# Patient Record
Sex: Male | Born: 1960 | Race: White | Hispanic: No | Marital: Married | Smoking: Never smoker
Health system: Southern US, Community
[De-identification: ages and names within clinical notes are randomized; demographics above are authoritative.]

## PROBLEM LIST (undated history)

## (undated) DIAGNOSIS — M069 Rheumatoid arthritis, unspecified: Secondary | ICD-10-CM

## (undated) DIAGNOSIS — E119 Type 2 diabetes mellitus without complications: Secondary | ICD-10-CM

## (undated) DIAGNOSIS — T884XXA Failed or difficult intubation, initial encounter: Secondary | ICD-10-CM

## (undated) DIAGNOSIS — G4730 Sleep apnea, unspecified: Secondary | ICD-10-CM

## (undated) DIAGNOSIS — I2699 Other pulmonary embolism without acute cor pulmonale: Secondary | ICD-10-CM

## (undated) DIAGNOSIS — K5790 Diverticulosis of intestine, part unspecified, without perforation or abscess without bleeding: Secondary | ICD-10-CM

## (undated) DIAGNOSIS — M359 Systemic involvement of connective tissue, unspecified: Secondary | ICD-10-CM

## (undated) DIAGNOSIS — I10 Essential (primary) hypertension: Secondary | ICD-10-CM

## (undated) DIAGNOSIS — Z9989 Dependence on other enabling machines and devices: Secondary | ICD-10-CM

## (undated) DIAGNOSIS — Z9981 Dependence on supplemental oxygen: Secondary | ICD-10-CM

---

## 1999-02-07 DIAGNOSIS — I2699 Other pulmonary embolism without acute cor pulmonale: Secondary | ICD-10-CM

## 1999-02-07 HISTORY — DX: Other pulmonary embolism without acute cor pulmonale: I26.99

## 2003-05-10 ENCOUNTER — Other Ambulatory Visit: Payer: Self-pay

## 2004-05-18 ENCOUNTER — Encounter: Payer: Self-pay | Admitting: Internal Medicine

## 2004-05-22 ENCOUNTER — Emergency Department: Payer: Self-pay | Admitting: Emergency Medicine

## 2004-06-06 ENCOUNTER — Encounter: Payer: Self-pay | Admitting: Internal Medicine

## 2004-07-07 ENCOUNTER — Encounter: Payer: Self-pay | Admitting: Internal Medicine

## 2004-08-06 ENCOUNTER — Encounter: Payer: Self-pay | Admitting: Internal Medicine

## 2004-09-06 ENCOUNTER — Encounter: Payer: Self-pay | Admitting: Internal Medicine

## 2004-10-07 ENCOUNTER — Encounter: Payer: Self-pay | Admitting: Internal Medicine

## 2004-11-06 ENCOUNTER — Encounter: Payer: Self-pay | Admitting: Internal Medicine

## 2004-12-07 ENCOUNTER — Encounter: Payer: Self-pay | Admitting: Internal Medicine

## 2005-01-06 ENCOUNTER — Encounter: Payer: Self-pay | Admitting: Internal Medicine

## 2005-02-06 ENCOUNTER — Encounter: Payer: Self-pay | Admitting: Internal Medicine

## 2010-10-29 DIAGNOSIS — M1710 Unilateral primary osteoarthritis, unspecified knee: Secondary | ICD-10-CM | POA: Insufficient documentation

## 2010-10-29 DIAGNOSIS — M069 Rheumatoid arthritis, unspecified: Secondary | ICD-10-CM | POA: Insufficient documentation

## 2010-10-29 DIAGNOSIS — M109 Gout, unspecified: Secondary | ICD-10-CM | POA: Insufficient documentation

## 2010-11-21 DIAGNOSIS — G4730 Sleep apnea, unspecified: Secondary | ICD-10-CM | POA: Insufficient documentation

## 2010-11-21 DIAGNOSIS — IMO0002 Reserved for concepts with insufficient information to code with codable children: Secondary | ICD-10-CM | POA: Insufficient documentation

## 2011-05-10 ENCOUNTER — Emergency Department: Payer: Self-pay | Admitting: *Deleted

## 2011-05-10 LAB — COMPREHENSIVE METABOLIC PANEL
Albumin: 3.8 g/dL (ref 3.4–5.0)
Alkaline Phosphatase: 71 U/L (ref 50–136)
BUN: 17 mg/dL (ref 7–18)
Bilirubin,Total: 0.7 mg/dL (ref 0.2–1.0)
Calcium, Total: 9.1 mg/dL (ref 8.5–10.1)
Co2: 32 mmol/L (ref 21–32)
EGFR (Non-African Amer.): >60
Glucose: 179 mg/dL — ABNORMAL HIGH (ref 65–99)
Osmolality: 284 (ref 275–301)
SGPT (ALT): 26 U/L
Total Protein: 8.3 g/dL — ABNORMAL HIGH (ref 6.4–8.2)

## 2011-05-10 LAB — CBC
HCT: 38 % — ABNORMAL LOW (ref 40.0–52.0)
HGB: 12.3 g/dL — ABNORMAL LOW (ref 13.0–18.0)
MCH: 29.6 pg (ref 26.0–34.0)
MCHC: 32.5 g/dL (ref 32.0–36.0)
Platelet: 347 10*3/uL (ref 150–440)
RBC: 4.16 10*6/uL — ABNORMAL LOW (ref 4.40–5.90)
RDW: 14.6 % — ABNORMAL HIGH (ref 11.5–14.5)
WBC: 11.1 10*3/uL — ABNORMAL HIGH (ref 3.8–10.6)

## 2011-05-10 LAB — PROTIME-INR: Prothrombin Time: 27.2 secs — ABNORMAL HIGH (ref 11.5–14.7)

## 2011-05-29 DIAGNOSIS — Z79899 Other long term (current) drug therapy: Secondary | ICD-10-CM | POA: Insufficient documentation

## 2011-11-02 DIAGNOSIS — M109 Gout, unspecified: Secondary | ICD-10-CM | POA: Insufficient documentation

## 2013-01-27 DIAGNOSIS — Z9981 Dependence on supplemental oxygen: Secondary | ICD-10-CM | POA: Insufficient documentation

## 2013-01-27 DIAGNOSIS — Z7901 Long term (current) use of anticoagulants: Secondary | ICD-10-CM | POA: Insufficient documentation

## 2014-09-30 DIAGNOSIS — Z6841 Body Mass Index (BMI) 40.0 and over, adult: Secondary | ICD-10-CM | POA: Insufficient documentation

## 2015-06-19 ENCOUNTER — Inpatient Hospital Stay: Payer: 59

## 2015-06-19 ENCOUNTER — Emergency Department: Payer: 59

## 2015-06-19 ENCOUNTER — Encounter: Payer: Self-pay | Admitting: Emergency Medicine

## 2015-06-19 ENCOUNTER — Inpatient Hospital Stay
Admission: EM | Admit: 2015-06-19 | Discharge: 2015-06-27 | DRG: 353 | Disposition: A | Discharge status: Home Health | Payer: 59 | Attending: General Surgery | Admitting: General Surgery

## 2015-06-19 DIAGNOSIS — Z794 Long term (current) use of insulin: Secondary | ICD-10-CM | POA: Diagnosis not present

## 2015-06-19 DIAGNOSIS — N170 Acute kidney failure with tubular necrosis: Secondary | ICD-10-CM | POA: Diagnosis not present

## 2015-06-19 DIAGNOSIS — J449 Chronic obstructive pulmonary disease, unspecified: Secondary | ICD-10-CM | POA: Diagnosis present

## 2015-06-19 DIAGNOSIS — J9600 Acute respiratory failure, unspecified whether with hypoxia or hypercapnia: Secondary | ICD-10-CM | POA: Diagnosis not present

## 2015-06-19 DIAGNOSIS — K5900 Constipation, unspecified: Secondary | ICD-10-CM | POA: Diagnosis present

## 2015-06-19 DIAGNOSIS — R0902 Hypoxemia: Secondary | ICD-10-CM | POA: Diagnosis present

## 2015-06-19 DIAGNOSIS — Z7901 Long term (current) use of anticoagulants: Secondary | ICD-10-CM

## 2015-06-19 DIAGNOSIS — Z888 Allergy status to other drugs, medicaments and biological substances status: Secondary | ICD-10-CM | POA: Diagnosis not present

## 2015-06-19 DIAGNOSIS — T380X5A Adverse effect of glucocorticoids and synthetic analogues, initial encounter: Secondary | ICD-10-CM | POA: Diagnosis present

## 2015-06-19 DIAGNOSIS — Z79899 Other long term (current) drug therapy: Secondary | ICD-10-CM

## 2015-06-19 DIAGNOSIS — Z9981 Dependence on supplemental oxygen: Secondary | ICD-10-CM | POA: Diagnosis not present

## 2015-06-19 DIAGNOSIS — Z6841 Body Mass Index (BMI) 40.0 and over, adult: Secondary | ICD-10-CM

## 2015-06-19 DIAGNOSIS — D72829 Elevated white blood cell count, unspecified: Secondary | ICD-10-CM | POA: Diagnosis present

## 2015-06-19 DIAGNOSIS — R079 Chest pain, unspecified: Secondary | ICD-10-CM | POA: Diagnosis present

## 2015-06-19 DIAGNOSIS — E119 Type 2 diabetes mellitus without complications: Secondary | ICD-10-CM | POA: Diagnosis present

## 2015-06-19 DIAGNOSIS — G4733 Obstructive sleep apnea (adult) (pediatric): Secondary | ICD-10-CM | POA: Diagnosis present

## 2015-06-19 DIAGNOSIS — M069 Rheumatoid arthritis, unspecified: Secondary | ICD-10-CM | POA: Diagnosis present

## 2015-06-19 DIAGNOSIS — E118 Type 2 diabetes mellitus with unspecified complications: Secondary | ICD-10-CM | POA: Diagnosis not present

## 2015-06-19 DIAGNOSIS — R000 Tachycardia, unspecified: Secondary | ICD-10-CM | POA: Diagnosis not present

## 2015-06-19 DIAGNOSIS — Z86711 Personal history of pulmonary embolism: Secondary | ICD-10-CM

## 2015-06-19 DIAGNOSIS — Z7952 Long term (current) use of systemic steroids: Secondary | ICD-10-CM | POA: Diagnosis not present

## 2015-06-19 DIAGNOSIS — K436 Other and unspecified ventral hernia with obstruction, without gangrene: Secondary | ICD-10-CM | POA: Diagnosis present

## 2015-06-19 DIAGNOSIS — I959 Hypotension, unspecified: Secondary | ICD-10-CM | POA: Diagnosis not present

## 2015-06-19 DIAGNOSIS — D62 Acute posthemorrhagic anemia: Secondary | ICD-10-CM | POA: Diagnosis not present

## 2015-06-19 DIAGNOSIS — I10 Essential (primary) hypertension: Secondary | ICD-10-CM | POA: Diagnosis present

## 2015-06-19 DIAGNOSIS — R109 Unspecified abdominal pain: Secondary | ICD-10-CM | POA: Diagnosis present

## 2015-06-19 DIAGNOSIS — N179 Acute kidney failure, unspecified: Secondary | ICD-10-CM

## 2015-06-19 DIAGNOSIS — K460 Unspecified abdominal hernia with obstruction, without gangrene: Secondary | ICD-10-CM | POA: Diagnosis not present

## 2015-06-19 DIAGNOSIS — Z95828 Presence of other vascular implants and grafts: Secondary | ICD-10-CM

## 2015-06-19 HISTORY — DX: Failed or difficult intubation, initial encounter: T88.4XXA

## 2015-06-19 HISTORY — DX: Systemic involvement of connective tissue, unspecified: M35.9

## 2015-06-19 HISTORY — DX: Other pulmonary embolism without acute cor pulmonale: I26.99

## 2015-06-19 HISTORY — DX: Rheumatoid arthritis, unspecified: M06.9

## 2015-06-19 HISTORY — DX: Essential (primary) hypertension: I10

## 2015-06-19 HISTORY — DX: Dependence on other enabling machines and devices: Z99.89

## 2015-06-19 HISTORY — DX: Diverticulosis of intestine, part unspecified, without perforation or abscess without bleeding: K57.90

## 2015-06-19 HISTORY — DX: Morbid (severe) obesity due to excess calories: E66.01

## 2015-06-19 HISTORY — DX: Sleep apnea, unspecified: G47.30

## 2015-06-19 HISTORY — DX: Type 2 diabetes mellitus without complications: E11.9

## 2015-06-19 HISTORY — DX: Dependence on supplemental oxygen: Z99.81

## 2015-06-19 LAB — CBC
HEMATOCRIT: 38.7 % — AB (ref 40.0–52.0)
HEMOGLOBIN: 12.5 g/dL — AB (ref 13.0–18.0)
MCH: 30.1 pg (ref 26.0–34.0)
MCHC: 32.3 g/dL (ref 32.0–36.0)
MCV: 93.1 fL (ref 80.0–100.0)
Platelets: 299 10*3/uL (ref 150–440)
RBC: 4.15 MIL/uL — ABNORMAL LOW (ref 4.40–5.90)
RDW: 16.3 % — AB (ref 11.5–14.5)
WBC: 11.9 10*3/uL — ABNORMAL HIGH (ref 3.8–10.6)

## 2015-06-19 LAB — APTT: aPTT: 62 seconds — ABNORMAL HIGH (ref 24–36)

## 2015-06-19 LAB — BASIC METABOLIC PANEL
ANION GAP: 7 (ref 5–15)
BUN: 21 mg/dL — AB (ref 6–20)
CO2: 30 mmol/L (ref 22–32)
Calcium: 9.6 mg/dL (ref 8.9–10.3)
Chloride: 99 mmol/L — ABNORMAL LOW (ref 101–111)
Creatinine, Ser: 0.92 mg/dL (ref 0.61–1.24)
GFR calc Af Amer: >60 mL/min (ref >60)
GFR calc non Af Amer: >60 mL/min (ref >60)
GLUCOSE: 262 mg/dL — AB (ref 65–99)
POTASSIUM: 5.4 mmol/L — AB (ref 3.5–5.1)
Sodium: 136 mmol/L (ref 135–145)

## 2015-06-19 LAB — PROTIME-INR
INR: 2.48
Prothrombin Time: 26.5 seconds — ABNORMAL HIGH (ref 11.4–15.0)

## 2015-06-19 LAB — GLUCOSE, CAPILLARY: Glucose-Capillary: 181 mg/dL — ABNORMAL HIGH (ref 65–99)

## 2015-06-19 LAB — TROPONIN I: Troponin I: <0.03 ng/mL (ref <0.031)

## 2015-06-19 MED ORDER — DIATRIZOATE MEGLUMINE & SODIUM 66-10 % PO SOLN
15.0000 mL | Freq: Once | ORAL | Status: AC
  Administered 2015-06-19: 15 mL via ORAL

## 2015-06-19 MED ORDER — MORPHINE SULFATE (PF) 4 MG/ML IV SOLN
INTRAVENOUS | Status: AC
  Administered 2015-06-19: 4 mg via INTRAVENOUS
  Filled 2015-06-19: qty 1

## 2015-06-19 MED ORDER — VITAMIN K1 10 MG/ML IJ SOLN
1.0000 mg | Freq: Once | INTRAVENOUS | Status: AC
  Administered 2015-06-19: 1 mg via INTRAVENOUS
  Filled 2015-06-19: qty 0.1

## 2015-06-19 MED ORDER — ONDANSETRON HCL 4 MG/2ML IJ SOLN: 4.0000 mg | Freq: Four times a day (QID) | INTRAMUSCULAR | Status: DC | PRN

## 2015-06-19 MED ORDER — ONDANSETRON HCL 4 MG/2ML IJ SOLN
4.0000 mg | Freq: Once | INTRAMUSCULAR | Status: AC
  Administered 2015-06-19: 4 mg via INTRAVENOUS
  Filled 2015-06-19: qty 2

## 2015-06-19 MED ORDER — DIPHENHYDRAMINE HCL 25 MG PO CAPS: 25.0000 mg | ORAL_CAPSULE | Freq: Four times a day (QID) | ORAL | Status: DC | PRN

## 2015-06-19 MED ORDER — ONDANSETRON 4 MG PO TBDP
4.0000 mg | ORAL_TABLET | Freq: Four times a day (QID) | ORAL | Status: DC | PRN
  Filled 2015-06-19: qty 1

## 2015-06-19 MED ORDER — INSULIN ASPART 100 UNIT/ML ~~LOC~~ SOLN
0.0000 [IU] | SUBCUTANEOUS | Status: DC
  Administered 2015-06-19 – 2015-06-21 (×7): 4 [IU] via SUBCUTANEOUS
  Administered 2015-06-21 (×2): 3 [IU] via SUBCUTANEOUS
  Administered 2015-06-21: 4 [IU] via SUBCUTANEOUS
  Administered 2015-06-22: 7 [IU] via SUBCUTANEOUS
  Administered 2015-06-22: 4 [IU] via SUBCUTANEOUS
  Filled 2015-06-19 (×4): qty 4
  Filled 2015-06-19: qty 3
  Filled 2015-06-19: qty 4
  Filled 2015-06-19: qty 3
  Filled 2015-06-19 (×2): qty 4
  Filled 2015-06-19: qty 7
  Filled 2015-06-19 (×2): qty 4

## 2015-06-19 MED ORDER — IOPAMIDOL (ISOVUE-300) INJECTION 61%
125.0000 mL | Freq: Once | INTRAVENOUS | Status: AC | PRN
  Administered 2015-06-19: 125 mL via INTRAVENOUS

## 2015-06-19 MED ORDER — DIPHENHYDRAMINE HCL 50 MG/ML IJ SOLN: 25.0000 mg | Freq: Four times a day (QID) | INTRAMUSCULAR | Status: DC | PRN

## 2015-06-19 MED ORDER — MORPHINE SULFATE (PF) 4 MG/ML IV SOLN
4.0000 mg | Freq: Once | INTRAVENOUS | Status: AC
  Administered 2015-06-19: 4 mg via INTRAVENOUS

## 2015-06-19 MED ORDER — HYDRALAZINE HCL 20 MG/ML IJ SOLN: 10.0000 mg | INTRAMUSCULAR | Status: DC | PRN

## 2015-06-19 MED ORDER — SODIUM CHLORIDE 0.9 % IV SOLN
INTRAVENOUS | Status: DC
  Administered 2015-06-19 – 2015-06-22 (×6): via INTRAVENOUS

## 2015-06-19 MED ORDER — METRONIDAZOLE IN NACL 5-0.79 MG/ML-% IV SOLN
500.0000 mg | Freq: Three times a day (TID) | INTRAVENOUS | Status: DC
  Administered 2015-06-20 (×2): 500 mg via INTRAVENOUS
  Filled 2015-06-19 (×3): qty 100

## 2015-06-19 MED ORDER — CEFTRIAXONE SODIUM 2 G IJ SOLR
2.0000 g | Freq: Every day | INTRAMUSCULAR | Status: DC
  Administered 2015-06-20: 2 g via INTRAVENOUS
  Filled 2015-06-19: qty 2

## 2015-06-19 MED ORDER — MORPHINE SULFATE (PF) 4 MG/ML IV SOLN
4.0000 mg | Freq: Once | INTRAVENOUS | Status: AC
  Administered 2015-06-19: 4 mg via INTRAVENOUS
  Filled 2015-06-19: qty 1

## 2015-06-19 MED ORDER — PANTOPRAZOLE SODIUM 40 MG IV SOLR
40.0000 mg | Freq: Every day | INTRAVENOUS | Status: DC
  Administered 2015-06-19 – 2015-06-25 (×7): 40 mg via INTRAVENOUS
  Filled 2015-06-19 (×7): qty 40

## 2015-06-19 MED ORDER — MORPHINE SULFATE (PF) 4 MG/ML IV SOLN
4.0000 mg | INTRAVENOUS | Status: DC | PRN
  Administered 2015-06-19 – 2015-06-21 (×10): 4 mg via INTRAVENOUS
  Filled 2015-06-19 (×10): qty 1

## 2015-06-19 NOTE — H&P (Signed)
Patient ID: Seth Stafford, male   DOB: [birth year], 55 y.o.   MRN: [id number]  CC: Abdominal Pain  HPI Seth Stafford is a 55 y.o. male who presents to emergency department today for evaluation of abdominal pain and inability to have a bowel movement. Patient states that spent 3 days since his been able have bowel movement and that over the last 2 days he's noticed an increasing fullness to his long-standing abdominal wall hernia that has now become hard and painful. He denies any nausea or vomiting but states he's had a decrease in appetite. He's not about his hernia for very long time but he states is never been like this before. He states his abdominal pain comes in waves and primary located around his umbilicus. He denies any fevers, chills, nausea, vomiting or chest pain, change in energy. He does report constipation with no bowel movement last 3 days and he also has chronic shortness of breath which she is on home oxygen for  HPI  Past Medical History  Diagnosis Date  . Hypertension   . Diabetes mellitus without complication (HCC)   . RA (rheumatoid arthritis) (HCC)   . Sleep apnea   . CPAP (continuous positive airway pressure) dependence   . On home oxygen therapy   . Morbid (severe) obesity due to excess calories (HCC)   . PE (pulmonary embolism) 2001  . Diverticulosis   . Collagen vascular disease (HCC)     Past surgical history: Appendectomy Numerous soft tissue abscess drainages.  No family history on file.  Social History Social History  Substance Use Topics  . Smoking status: Never Smoker   . Smokeless tobacco: None  . Alcohol Use: No    Allergies  Allergen Reactions  . Remicade [Infliximab] Anaphylaxis    No current facility-administered medications for this encounter.   Current medications: . allopurinol Etanercept Folic acid Glipizide Plaquenil Lantus Lisinopril-hydrochlorothiazide Metformin Methotrexate Prednisone Coumadin      Review of  Systems A Multi-point review of systems was asked and was negative except for the findings documented in the history of present illness  Physical Exam Blood pressure 117/60, pulse 114, temperature 97.8 F (36.6 C), temperature source Oral, resp. rate 20, height 6' (1.829 m), weight 179.171 kg (395 lb), SpO2 96 %. CONSTITUTIONAL: Resting in bed in no acute distress. EYES: Pupils are equal, round, and reactive to light, Sclera are non-icteric. EARS, NOSE, MOUTH AND THROAT: The oropharynx is clear. The oral mucosa is pink and moist. Hearing is intact to voice. LYMPH NODES:  Lymph nodes in the neck are normal. RESPIRATORY:  Lungs are clear. There is normal respiratory effort, with equal breath sounds bilaterally, and without pathologic use of accessory muscles. CARDIOVASCULAR: Heart is regular without murmurs, gallops, or rubs. GI: The abdomen is extremely large, soft, tender to palpation at the visible supraumbilical hernia, and nondistended. The hernia itself is partially reducible but immediately returns to its pre-reducing size with any Valsalva the patient. There is no hepatosplenomegaly. There are normal bowel sounds in all quadrants. There are no visible skin changes over the hernia. GU: Rectal deferred.   MUSCULOSKELETAL: Normal muscle strength and tone. No cyanosis or edema.   SKIN: Turgor is good and there are no pathologic skin lesions or ulcers. NEUROLOGIC: Motor and sensation is grossly normal. Cranial nerves are grossly intact. PSYCH:  Oriented to person, place and time. Affect is normal.  Data Reviewed Images and labs reviewed. Labs showed a mild leukocytosis of 11.9, hyperkalemia 5.4, hyperglycemia  of 262, elevated INR 2.48 which is in therapeutic range for his Coumadin. CT scan of the abdomen does show a large supraumbilical hernia that is containing of the sigmoid colon. There is some fat stranding around the sigmoid colon within the hernia sac. No evidence of obstruction visualized  as the colon going into and out of the hernia sac or the same caliber. I have personally reviewed the patient's imaging, laboratory findings and medical records.    Assessment    Incarcerated ventral hernia    Plan    55 year old superobese male with an incarcerated ventral hernia. There are currently no outward signs of strangulation or image findings of obstruction. Given these findings discussed with the patient that he should undergo a hernia repair after he has been resuscitated and his Coumadin has been reversed. There is currently no indication to give him FFP at this time. We will stop his Coumadin and give him vitamin K today. Admit him to the hospital from the emergency department. An plan to take him to the operating room for an open ventral hernia repair once it is deemed safe to do so. I will obtain an internal medicine consult after admission for assistance with his numerous current medications. Discussed with patient that his hernia repair would be at an increased risk secondary to his size and current chronic medications he is on. He voiced understanding. All questions answered to his satisfaction in the emergency room.     Time spent with the patient was 60 minutes, with more than 50% of the time spent in face-to-face education, counseling and care coordination.     Ricarda Frame, MD FACS General Surgeon 06/19/2015, 8:29 PM

## 2015-06-19 NOTE — ED Notes (Signed)
States abd pain periumbilical since 4am and states he has had no bm x 3 days. Took 3 stool softner tablets last night, drank miralax at 10am, and took a stool softner pill this am.

## 2015-06-19 NOTE — ED Provider Notes (Signed)
[facility] Emergency Department Provider Note  ____________________________________________    I have reviewed the triage vital signs and the nursing notes.   HISTORY  Chief Complaint Abdominal Pain and Chest Pain    HPI Seth Stafford is a 55 y.o. male who presents with complaints of abdominal pain. Patient reports long-standing abdominal hernia. Over the last 2 days he reports it is been treating more and more and is firm and hard and painful. He reports he has not had a bowel movement in 3 days. He denies nausea or vomiting. He has not had an appetite today. He is on home oxygen and Coumadin and also has a history of rheumatoid arthritis.     Past Medical History  Diagnosis Date  . Hypertension   . Diabetes mellitus without complication (HCC)   . RA (rheumatoid arthritis) (HCC)   . Sleep apnea   . CPAP (continuous positive airway pressure) dependence   . On home oxygen therapy   . Morbid (severe) obesity due to excess calories (HCC)   . PE (pulmonary embolism) 2001  . Diverticulosis     There are no active problems to display for this patient.   No past surgical history on file.  No current outpatient prescriptions on file.  Allergies Remicade  No family history on file.  Social History Social History  Substance Use Topics  . Smoking status: Never Smoker   . Smokeless tobacco: None  . Alcohol Use: No    Review of Systems  Constitutional: Negative for fever.  ENT: Negative for sore throat Cardiovascular: Denies Chest pain Respiratory: Chronic shortness of breath Gastrointestinal: As above Genitourinary: Negative for dysuria. Musculoskeletal: Negative for back pain. Skin: Negative for rash. Neurological: Negative for headache Psychiatric: no anxiety    ____________________________________________   PHYSICAL EXAM:  VITAL SIGNS: ED Triage Vitals  Enc Vitals Group     BP 06/19/15 1615 147/92 mmHg     Pulse Rate  06/19/15 1334 108     Resp 06/19/15 1334 22     Temp 06/19/15 1334 97.8 F (36.6 C)     Temp Source 06/19/15 1334 Oral     SpO2 06/19/15 1334 93 %     Weight 06/19/15 1336 395 lb (179.171 kg)     Height 06/19/15 1334 6' (1.829 m)     Head Cir --      Peak Flow --      Pain Score 06/19/15 1337 9     Pain Loc --      Pain Edu? --      Excl. in GC? --      Constitutional: Alert and oriented. Uncomfortable, morbidly obese Eyes: Conjunctivae are normal. No erythema or injection ENT   Head: Normocephalic and atraumatic.   Mouth/Throat: Mucous membranes are moist. Cardiovascular: Tach cardia, regular rhythm. Normal and symmetric distal pulses are present in the upper extremities.  Respiratory: Normal respiratory effort without tachypnea nor retractions.  Gastrointestinal: Protruding firm tender hernia inferolateral to the umbilicus, unable to reduce. No distention. There is no CVA tenderness. Genitourinary: deferred Musculoskeletal: Nontender with normal range of motion in all extremities.  Neurologic:  Normal speech and language. No gross focal neurologic deficits are appreciated. Skin:  Skin is warm, dry and intact. No rash noted. Psychiatric: Mood and affect are normal. Patient exhibits appropriate insight and judgment.  ____________________________________________    LABS (pertinent positives/negatives)  Labs Reviewed  BASIC METABOLIC PANEL - Abnormal; Notable for the following:    Potassium 5.4 (*)  Chloride 99 (*)    Glucose, Bld 262 (*)    BUN 21 (*)    All other components within normal limits  CBC - Abnormal; Notable for the following:    WBC 11.9 (*)    RBC 4.15 (*)    Hemoglobin 12.5 (*)    HCT 38.7 (*)    RDW 16.3 (*)    All other components within normal limits  PROTIME-INR - Abnormal; Notable for the following:    Prothrombin Time 26.5 (*)    All other components within normal limits  APTT - Abnormal; Notable for the following:    aPTT 62 (*)     All other components within normal limits  TROPONIN I    ____________________________________________   EKG  ED ECG REPORT I, Jene Every, the attending physician, personally viewed and interpreted this ECG.  Date: 06/19/2015 EKG Time: 1:40 PM Rate: 103 Rhythm: Sinus tachycardia QRS Axis: normal Intervals: normal ST/T Wave abnormalities: normal Conduction Disturbances: none Narrative Interpretation: unremarkable   ____________________________________________    RADIOLOGY  X-ray of abdomen unremarkable, chest x-ray shows minimal vascular congestion  ____________________________________________   PROCEDURES  Procedure(s) performed: none  Critical Care performed: none  ____________________________________________   INITIAL IMPRESSION / ASSESSMENT AND PLAN / ED COURSE  Pertinent labs & imaging results that were available during my care of the patient were reviewed by me and considered in my medical decision making (see chart for details).  Patient presents with abdominal pain, he has a relatively firm and mildly tender erythematous hernia. I'm unable to reduce it. X-ray is not consistent with SBO, we will obtain CT for further evaluation  CT concerning for diverticulitis versus strangulation, Dr. Tonita Cong to see the patient  Dr. Tonita Cong to admit the patient  ____________________________________________   FINAL CLINICAL IMPRESSION(S) / ED DIAGNOSES  Final diagnoses:  Abdominal pain   Diverticulitis       Jene Every, MD 06/19/15 2043

## 2015-06-19 NOTE — ED Notes (Signed)
Patient states he has a history of abdominal hernia.  C/O pain to hernia site and hernia swelling.  Onset this morning at 0300.  Also c/o chest pain that started this morning at 0300.

## 2015-06-20 ENCOUNTER — Inpatient Hospital Stay: Payer: 59

## 2015-06-20 ENCOUNTER — Encounter: Payer: Self-pay | Admitting: *Deleted

## 2015-06-20 DIAGNOSIS — K460 Unspecified abdominal hernia with obstruction, without gangrene: Secondary | ICD-10-CM

## 2015-06-20 LAB — BASIC METABOLIC PANEL
Anion gap: 6 (ref 5–15)
BUN: 21 mg/dL — AB (ref 6–20)
CALCIUM: 8.9 mg/dL (ref 8.9–10.3)
CHLORIDE: 98 mmol/L — AB (ref 101–111)
CO2: 31 mmol/L (ref 22–32)
CREATININE: 0.9 mg/dL (ref 0.61–1.24)
GFR calc non Af Amer: >60 mL/min (ref >60)
Glucose, Bld: 209 mg/dL — ABNORMAL HIGH (ref 65–99)
Potassium: 5.2 mmol/L — ABNORMAL HIGH (ref 3.5–5.1)
SODIUM: 135 mmol/L (ref 135–145)

## 2015-06-20 LAB — PROTIME-INR
INR: 2.44
Prothrombin Time: 26.2 seconds — ABNORMAL HIGH (ref 11.4–15.0)

## 2015-06-20 LAB — GLUCOSE, CAPILLARY
GLUCOSE-CAPILLARY: 196 mg/dL — AB (ref 65–99)
GLUCOSE-CAPILLARY: 192 mg/dL — AB (ref 65–99)
GLUCOSE-CAPILLARY: 195 mg/dL — AB (ref 65–99)
Glucose-Capillary: 167 mg/dL — ABNORMAL HIGH (ref 65–99)
Glucose-Capillary: 170 mg/dL — ABNORMAL HIGH (ref 65–99)
Glucose-Capillary: 198 mg/dL — ABNORMAL HIGH (ref 65–99)

## 2015-06-20 LAB — MAGNESIUM: MAGNESIUM: 1.2 mg/dL — AB (ref 1.7–2.4)

## 2015-06-20 LAB — ABO/RH: ABO/RH(D): O POS

## 2015-06-20 LAB — CBC
HEMATOCRIT: 35.9 % — AB (ref 40.0–52.0)
HEMOGLOBIN: 11.8 g/dL — AB (ref 13.0–18.0)
MCH: 30.8 pg (ref 26.0–34.0)
MCHC: 32.8 g/dL (ref 32.0–36.0)
MCV: 94.1 fL (ref 80.0–100.0)
Platelets: 285 10*3/uL (ref 150–440)
RBC: 3.81 MIL/uL — ABNORMAL LOW (ref 4.40–5.90)
RDW: 16.7 % — AB (ref 11.5–14.5)
WBC: 10 10*3/uL (ref 3.8–10.6)

## 2015-06-20 LAB — SURGICAL PCR SCREEN
MRSA, PCR: NEGATIVE
STAPHYLOCOCCUS AUREUS: NEGATIVE

## 2015-06-20 LAB — PHOSPHORUS: PHOSPHORUS: 4.3 mg/dL (ref 2.5–4.6)

## 2015-06-20 LAB — APTT: aPTT: 55 seconds — ABNORMAL HIGH (ref 24–36)

## 2015-06-20 MED ORDER — ALBUMIN HUMAN 25 % IV SOLN
25.0000 g | Freq: Once | INTRAVENOUS | Status: AC
  Administered 2015-06-20: 25 g via INTRAVENOUS
  Filled 2015-06-20: qty 100

## 2015-06-20 MED ORDER — HYDROCORTISONE NA SUCCINATE PF 100 MG IJ SOLR
50.0000 mg | Freq: Three times a day (TID) | INTRAMUSCULAR | Status: DC
  Administered 2015-06-20 – 2015-06-22 (×6): 50 mg via INTRAVENOUS
  Filled 2015-06-20: qty 2
  Filled 2015-06-20 (×4): qty 1
  Filled 2015-06-20: qty 4
  Filled 2015-06-20 (×2): qty 1

## 2015-06-20 MED ORDER — SODIUM CHLORIDE 0.9% FLUSH
10.0000 mL | Freq: Two times a day (BID) | INTRAVENOUS | Status: DC
  Administered 2015-06-20 – 2015-06-27 (×14): 10 mL via INTRAVENOUS

## 2015-06-20 MED ORDER — SODIUM CHLORIDE 0.9 % IV SOLN
Freq: Once | INTRAVENOUS | Status: AC
  Administered 2015-06-20: 40 mL via INTRAVENOUS

## 2015-06-20 MED ORDER — ACETAMINOPHEN 325 MG PO TABS
650.0000 mg | ORAL_TABLET | Freq: Four times a day (QID) | ORAL | Status: DC | PRN
  Administered 2015-06-20 (×2): 650 mg via ORAL
  Filled 2015-06-20 (×2): qty 2

## 2015-06-20 MED ORDER — PIPERACILLIN-TAZOBACTAM 4.5 G IVPB
4.5000 g | Freq: Three times a day (TID) | INTRAVENOUS | Status: DC
  Administered 2015-06-20 – 2015-06-21 (×6): 4.5 g via INTRAVENOUS
  Filled 2015-06-20 (×9): qty 100

## 2015-06-20 MED ORDER — SODIUM CHLORIDE 0.9 % IV SOLN
Freq: Once | INTRAVENOUS | Status: AC
  Administered 2015-06-20: 19:00:00 via INTRAVENOUS

## 2015-06-20 MED ORDER — MAGNESIUM SULFATE 4 GM/100ML IV SOLN
4.0000 g | Freq: Once | INTRAVENOUS | Status: AC
  Administered 2015-06-20: 4 g via INTRAVENOUS
  Filled 2015-06-20: qty 100

## 2015-06-20 MED ORDER — VITAMIN K1 10 MG/ML IJ SOLN
10.0000 mg | Freq: Once | INTRAMUSCULAR | Status: AC
  Administered 2015-06-20: 10 mg via INTRAVENOUS
  Filled 2015-06-20: qty 1

## 2015-06-20 MED ORDER — SIMETHICONE 80 MG PO CHEW
80.0000 mg | CHEWABLE_TABLET | Freq: Four times a day (QID) | ORAL | Status: DC | PRN
  Administered 2015-06-20: 80 mg via ORAL
  Filled 2015-06-20: qty 1

## 2015-06-20 NOTE — Progress Notes (Signed)
Called Dr. Everlene Farrier and PICC line is OK to use. Second unit of FFP started as ordered. Lab instructed to get the other 2 units ready.

## 2015-06-20 NOTE — Progress Notes (Addendum)
CC:  Incarcerated ventral hernia super morbidly obese male Subjective: Persistent abdominal pain No N/V Objective: Vital signs in last 24 hours: Temp:  [97.8 F (36.6 C)-98.3 F (36.8 C)] 98.2 F (36.8 C) (05/14 0559) Pulse Rate:  [95-115] 102 (05/14 0559) Resp:  [15-22] 18 (05/14 0559) BP: (117-149)/(51-92) 126/82 mmHg (05/14 0559) SpO2:  [91 %-100 %] 96 % (05/14 0559) Weight:  [177.084 kg (390 lb 6.4 oz)-179.171 kg (395 lb)] 177.084 kg (390 lb 6.4 oz) (05/14 0115)    Intake/Output from previous day: 05/13 0701 - 05/14 0700 In: 834 [I.V.:650; IV Piggyback:184] Out: -  Intake/Output this shift:    Physical exam: Morbidly obese male in no acute distress awake alert  Chest: CTA, s1,s2 Abd: incarcerated ventral hernia, no peritonitis ExtWell perfused, no edema Neuro: awake alert   Lab Results: CBC   Recent Labs  06/19/15 1342 06/20/15 0502  WBC 11.9* 10.0  HGB 12.5* 11.8*  HCT 38.7* 35.9*  PLT 299 285   BMET  Recent Labs  06/19/15 1342 06/20/15 0502  NA 136 135  K 5.4* 5.2*  CL 99* 98*  CO2 30 31  GLUCOSE 262* 209*  BUN 21* 21*  CREATININE 0.92 0.90  CALCIUM 9.6 8.9   PT/INR  Recent Labs  06/19/15 1342 06/20/15 0502  LABPROT 26.5* 26.2*  INR 2.48 2.44   ABG No results for input(s): PHART, HCO3 in the last 72 hours.  Invalid input(s): PCO2, PO2  Studies/Results: Dg Chest 2 View  06/19/2015  CLINICAL DATA:  Chest pain beginning this morning. 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 EXAM: CHEST  2 VIEW COMPARISON:  None. FINDINGS: Moderate elevation of the right hemidiaphragm. Subtle linear density adjacent the elevated right hemidiaphragm likely atelectasis. Minimal prominence of the left perihilar markings which may be due to mild vascular congestion. No evidence of effusion. Cardiac silhouette difficult to evaluate due to the elevated right hemidiaphragm. Degenerative change of the spine. IMPRESSION: Suggestion of minimal vascular congestion.  Moderate elevation of the right hemidiaphragm with minimal associated right base atelectasis. Electronically Signed   By: Elberta Fortis M.D.   On: 06/19/2015 15:08   Ct Abdomen Pelvis W Contrast  06/19/2015  CLINICAL DATA:  55 year old male with ventral hernia and abdominal pain. EXAM: CT ABDOMEN AND PELVIS WITH CONTRAST TECHNIQUE: Multidetector CT imaging of the abdomen and pelvis was performed using the standard protocol following bolus administration of intravenous contrast. CONTRAST:  ISOVUE-300 IOPAMIDOL (ISOVUE-300) INJECTION 61% COMPARISON:  None. FINDINGS: Evaluation is limited due to streak artifact caused by patient's arms. The visualized left lung base is clear. There is coronary vascular calcification. No intra-abdominal free air or free fluid identified. Multiple stones noted within the gallbladder. No pericholecystic fluid. Ultrasound may provide better evaluation of the gallbladder. The liver, pancreas, spleen, adrenal glands, kidneys, visualized ureters, and urinary bladder appear unremarkable. The prostate and seminal vesicles are grossly unremarkable. There is a 5 cm defect in the midline anterior abdominal wall at the level of the umbilicus with herniation of the mesenteric fat. There is also herniation of a segment of the sigmoid colon within this hernia. There is inflammatory changes of the herniated segment of the sigmoid colon with stranding of the herniated fat. Small amount of fluid noted within the hernia sac. Inflammatory changes of the sigmoid colon within the hernia sac likely represent acute diverticulitis and are less likely related to strangulation of the bowel as the hernia has a wide neck and there is no evidence of bowel  dilatation. No pneumatosis. Oral contrast opacifies the stomach and multiple loops of small bowel. There is no evidence of small-bowel obstruction. The appendix is not visualized with certainty. No inflammatory changes identified in the right lower  quadrant. The abdominal aorta and IVC appear unremarkable. No portal venous gas identified. There is no adenopathy. There is skin thickening and stranding of the subcutaneous fat in the anterior pelvic wall. Mild degenerative changes of the spine. No acute fracture. IMPRESSION: Midline lower abdominal ventral or paraumbilical hernia defect containing a segment of the sigmoid colon. There is inflammatory changes of the sigmoid colon within the hernia sac likely representing acute diverticulitis. Strangulation of the herniated bowel is less likely. Clinical correlation is recommended. No drainable fluid collection. No free air. Electronically Signed   By: Elgie Collard M.D.   On: 06/19/2015 19:07   Dg Abd 2 Views  06/20/2015  CLINICAL DATA:  Abdominal pain, no bowel movement for 3 days EXAM: ABDOMEN - 2 VIEW COMPARISON:  5/13/ 17 FINDINGS: There is normal small bowel gas pattern. Mild distension of the right colon transverse colon and proximal left colon with gas and stool probable mild ileus. Paucity of bowel gas in distal sigmoid colon and rectum. Contrast material from recent CT scan noted within urinary bladder. No evidence of free abdominal air. IMPRESSION: Normal small bowel gas pattern. Mild distension of the right colon transverse colon and proximal left colon with gas and stool probable colonic ileus. Paucity of bowel gas in distal sigmoid colon and rectum. Electronically Signed   By: Natasha Mead M.D.   On: 06/20/2015 10:33   Dg Abd 2 Views  06/19/2015  CLINICAL DATA:  Periumbilical pain for several hours, history of abdominal wall hernia. EXAM: ABDOMEN - 2 VIEW COMPARISON:  None. FINDINGS: Scattered large and small bowel gas is noted. No abnormal mass or abnormal calcifications are seen. Mild fecal material is noted within the colon. No true obstructive changes are seen. No free air is noted. Mild degenerative changes of the thoracic and lumbar spine are seen. IMPRESSION: No acute abnormality noted.  Electronically Signed   By: Alcide Clever M.D.   On: 06/19/2015 17:29    Anti-infectives: Anti-infectives    Start     Dose/Rate Route Frequency Ordered Stop   06/20/15 0745  piperacillin-tazobactam (ZOSYN) IVPB 4.5 g     4.5 g 200 mL/hr over 30 Minutes Intravenous Every 8 hours 06/20/15 0743     06/19/15 2200  cefTRIAXone (ROCEPHIN) 2 g in dextrose 5 % 50 mL IVPB  Status:  Discontinued     2 g 100 mL/hr over 30 Minutes Intravenous Daily at bedtime 06/19/15 2143 06/20/15 0743   06/19/15 2200  metroNIDAZOLE (FLAGYL) IVPB 500 mg  Status:  Discontinued     500 mg 100 mL/hr over 60 Minutes Intravenous Every 8 hours 06/19/15 2143 06/20/15 0743      Assessment/Plan:  Incarcerated ventral hernia super morbidly obese male with multiple other comorbidities including a history of PE, anticoagulated, [place] steroids sleep apnea, COPD, DM .. CT scan shows evidence of sigmoid colon within the hernia. I KUB this morning shows some dilation of the right colon , no evidence of pneumatosis no evidence of free air Complex situation, he needs to be reversed from his anticoagulation and I have ordered 2 units of FFP and fortunately there is no gastritis think that he needs to be operated on tomorrow and the options are no very good regarding the repair. He may need a Hartmann's  procedure, since sigmoid colon is involved. Discussed with the patient in detail about the extent of surgery , due his co  morbidities and risk factors he is a high risk surgery for his airway and also for recurrence of his repair. He understands , but as I stated before there are no good options in this situation. We'll starrt with the FFP to reverse him and hopefully schedule him for surgery tomorrow in the morning.  At this point there is no need for immediate surgical intervention Extensive counseling provided, if he deteriorates may require emergent procedure. He understands.   Sterling Big, MD, [facility]  06/20/2015

## 2015-06-20 NOTE — Progress Notes (Signed)
PICC line team here to place PICC line.

## 2015-06-20 NOTE — Consult Note (Signed)
[facility] - [place] at [facility]   PATIENT NAME: Seth Stafford    MR#:  [id number]  DATE OF BIRTH:  February 18, 1960  DATE OF ADMISSION:  06/19/2015  PRIMARY CARE PHYSICIAN: Barbette Reichmann, MD   REQUESTING/REFERRING PHYSICIAN: dr Tonita Cong  CHIEF COMPLAINT:   Chief Complaint  Patient presents with  . Abdominal Pain  . Chest Pain    HISTORY OF PRESENT ILLNESS:  Seth Stafford  is a 55 y.o. male with a known history of Hypertension, sleep apnea using CPAP, RA, type 2 diabetes, morbid obesity comes to the emergency room with abdominal pain for last 3 days. Patient said he started having mid abdominal pain progressively worsening imaging studies found his incarcerated ventral hernia. Admitted on surgical floor. Internal medicine consulted for management of chronic medical issues.  PAST MEDICAL HISTORY:   Past Medical History  Diagnosis Date  . Hypertension   . Diabetes mellitus without complication (HCC)   . RA (rheumatoid arthritis) (HCC)   . Sleep apnea   . CPAP (continuous positive airway pressure) dependence   . On home oxygen therapy   . Morbid (severe) obesity due to excess calories (HCC)   . PE (pulmonary embolism) 2001  . Diverticulosis   . Collagen vascular disease (HCC)     PAST SURGICAL HISTOIRY:  History reviewed. No pertinent past surgical history.  SOCIAL HISTORY:   Social History  Substance Use Topics  . Smoking status: Never Smoker   . Smokeless tobacco: Not on file  . Alcohol Use: No    FAMILY HISTORY:  History reviewed. No pertinent family history.  DRUG ALLERGIES:   Allergies  Allergen Reactions  . Remicade [Infliximab] Anaphylaxis    REVIEW OF SYSTEMS:   Review of Systems  Constitutional: Negative for fever, chills and weight loss.  HENT: Negative for ear discharge, ear pain and nosebleeds.   Eyes: Negative for blurred vision, pain and discharge.  Respiratory: Negative for sputum production, shortness of breath,  wheezing and stridor.   Cardiovascular: Negative for chest pain, palpitations, orthopnea and PND.  Gastrointestinal: Positive for abdominal pain. Negative for nausea, vomiting and diarrhea.  Genitourinary: Negative for urgency and frequency.  Musculoskeletal: Negative for back pain and joint pain.  Neurological: Positive for weakness. Negative for sensory change, speech change and focal weakness.  Psychiatric/Behavioral: Negative for depression and hallucinations. The patient is not nervous/anxious.   All other systems reviewed and are negative.  MEDICATIONS AT HOME:   Prior to Admission medications   Medication Sig Start Date End Date Taking? Authorizing Provider  allopurinol (ZYLOPRIM) 100 MG tablet Take 100 mg by mouth every morning. *Note dose* 04/26/15  Yes Historical Provider, MD  allopurinol (ZYLOPRIM) 300 MG tablet Take 300 mg by mouth daily. In the afternoon. *Note dose* 04/26/15  Yes Historical Provider, MD  Cholecalciferol (VITAMIN D-1000 MAX ST) 1000 units tablet Take 1,000 Units by mouth daily.   Yes Historical Provider, MD  clotrimazole-betamethasone (LOTRISONE) cream Apply 1 application topically 2 (two) times daily. 06/08/15  Yes Historical Provider, MD  Coenzyme Q10 (COQ10) 200 MG CAPS Take 200 mg by mouth daily.   Yes Historical Provider, MD  Cyanocobalamin (RA VITAMIN B-12 TR) 1000 MCG TBCR Take 1,000 mg by mouth daily.   Yes Historical Provider, MD  ENBREL SURECLICK 50 MG/ML injection Inject 1 mL into the skin once a week. 04/21/15  Yes Historical Provider, MD  folic acid (FOLVITE) 1 MG tablet Take 3 mg by mouth daily. 04/26/15  Yes Historical Provider, MD  glipiZIDE (GLUCOTROL) 10 MG tablet Take 10 mg by mouth 2 (two) times daily. 04/08/15  Yes Historical Provider, MD  hydroxychloroquine (PLAQUENIL) 200 MG tablet Take 200 mg by mouth 2 (two) times daily. 04/08/15  Yes Historical Provider, MD  lisinopril-hydrochlorothiazide (PRINZIDE,ZESTORETIC) 20-12.5 MG tablet Take 1 tablet by  mouth daily. 04/26/15  Yes Historical Provider, MD  metFORMIN (GLUCOPHAGE) 1000 MG tablet Take 1,000 mg by mouth 2 (two) times daily with a meal. 04/26/15  Yes Historical Provider, MD  methotrexate (RHEUMATREX) 2.5 MG tablet Take 25 mg by mouth every 7 (seven) days. Take on Wednesday. 04/08/15  Yes Historical Provider, MD  Multiple Vitamin (MULTI-VITAMINS) TABS Take 1 tablet by mouth daily.   Yes Historical Provider, MD  predniSONE (DELTASONE) 5 MG tablet Take 10 mg by mouth daily. 04/26/15  Yes Historical Provider, MD  warfarin (COUMADIN) 1 MG tablet Take 1 mg by mouth daily. Take along with 6 mg tablet to equal total dose of 7 mg. 03/13/15  Yes Historical Provider, MD  warfarin (COUMADIN) 6 MG tablet Take 6 mg by mouth daily. Take along with 1 mg tablet to equal 7 mg total dose. 03/13/15  Yes Historical Provider, MD      VITAL SIGNS:  Blood pressure 126/82, pulse 102, temperature 98.2 F (36.8 C), temperature source Oral, resp. rate 18, height 6' (1.829 m), weight 177.084 kg (390 lb 6.4 oz), SpO2 96 %.  PHYSICAL EXAMINATION:  GENERAL:  55 y.o.-year-old patient lying in the bed with no acute distress. Morbid obesity EYES: Pupils equal, round, reactive to light and accommodation. No scleral icterus. Extraocular muscles intact.  HEENT: Head atraumatic, normocephalic. Oropharynx and nasopharynx clear.  NECK:  Supple, no jugular venous distention. No thyroid enlargement, no tenderness.  LUNGS: Normal breath sounds bilaterally, no wheezing, rales,rhonchi or crepitation. No use of accessory muscles of respiration.  CARDIOVASCULAR: S1, S2 normal. No murmurs, rubs, or gallops.  ABDOMEN: Soft, nontender, nondistended. Bowel sounds present. No organomegaly or mass. Mid abdominal pain plus EXTREMITIES: No pedal edema, cyanosis, or clubbing.  NEUROLOGIC: Cranial nerves II through XII are intact. Muscle strength 5/5 in all extremities. Sensation intact. Gait not checked.  PSYCHIATRIC: The patient is alert and  oriented x 3.  SKIN: No obvious rash, lesion, or ulcer.   LABORATORY PANEL:   CBC  Recent Labs Lab 06/20/15 0502  WBC 10.0  HGB 11.8*  HCT 35.9*  PLT 285   ------------------------------------------------------------------------------------------------------------------  Chemistries   Recent Labs Lab 06/20/15 0502  NA 135  K 5.2*  CL 98*  CO2 31  GLUCOSE 209*  BUN 21*  CREATININE 0.90  CALCIUM 8.9  MG 1.2*   ------------------------------------------------------------------------------------------------------------------  Cardiac Enzymes  Recent Labs Lab 06/19/15 1342  TROPONINI <0.03   ------------------------------------------------------------------------------------------------------------------  RADIOLOGY:  Dg Chest 2 View  06/19/2015  CLINICAL DATA:  Chest pain beginning this morning. 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 EXAM: CHEST  2 VIEW COMPARISON:  None. FINDINGS: Moderate elevation of the right hemidiaphragm. Subtle linear density adjacent the elevated right hemidiaphragm likely atelectasis. Minimal prominence of the left perihilar markings which may be due to mild vascular congestion. No evidence of effusion. Cardiac silhouette difficult to evaluate due to the elevated right hemidiaphragm. Degenerative change of the spine. IMPRESSION: Suggestion of minimal vascular congestion. Moderate elevation of the right hemidiaphragm with minimal associated right base atelectasis. Electronically Signed   By: Elberta Fortis M.D.   On: 06/19/2015 15:08   Ct Abdomen Pelvis W Contrast  06/19/2015  CLINICAL DATA:  55 year old male with ventral hernia and abdominal pain. EXAM: CT ABDOMEN AND PELVIS WITH CONTRAST TECHNIQUE: Multidetector CT imaging of the abdomen and pelvis was performed using the standard protocol following bolus administration of intravenous contrast. CONTRAST:  ISOVUE-300 IOPAMIDOL (ISOVUE-300) INJECTION 61% COMPARISON:  None. FINDINGS:  Evaluation is limited due to streak artifact caused by patient's arms. The visualized left lung base is clear. There is coronary vascular calcification. No intra-abdominal free air or free fluid identified. Multiple stones noted within the gallbladder. No pericholecystic fluid. Ultrasound may provide better evaluation of the gallbladder. The liver, pancreas, spleen, adrenal glands, kidneys, visualized ureters, and urinary bladder appear unremarkable. The prostate and seminal vesicles are grossly unremarkable. There is a 5 cm defect in the midline anterior abdominal wall at the level of the umbilicus with herniation of the mesenteric fat. There is also herniation of a segment of the sigmoid colon within this hernia. There is inflammatory changes of the herniated segment of the sigmoid colon with stranding of the herniated fat. Small amount of fluid noted within the hernia sac. Inflammatory changes of the sigmoid colon within the hernia sac likely represent acute diverticulitis and are less likely related to strangulation of the bowel as the hernia has a wide neck and there is no evidence of bowel dilatation. No pneumatosis. Oral contrast opacifies the stomach and multiple loops of small bowel. There is no evidence of small-bowel obstruction. The appendix is not visualized with certainty. No inflammatory changes identified in the right lower quadrant. The abdominal aorta and IVC appear unremarkable. No portal venous gas identified. There is no adenopathy. There is skin thickening and stranding of the subcutaneous fat in the anterior pelvic wall. Mild degenerative changes of the spine. No acute fracture. IMPRESSION: Midline lower abdominal ventral or paraumbilical hernia defect containing a segment of the sigmoid colon. There is inflammatory changes of the sigmoid colon within the hernia sac likely representing acute diverticulitis. Strangulation of the herniated bowel is less likely. Clinical correlation is  recommended. No drainable fluid collection. No free air. Electronically Signed   By: Elgie Collard M.D.   On: 06/19/2015 19:07   Dg Abd 2 Views  06/20/2015  CLINICAL DATA:  Abdominal pain, no bowel movement for 3 days EXAM: ABDOMEN - 2 VIEW COMPARISON:  5/13/ 17 FINDINGS: There is normal small bowel gas pattern. Mild distension of the right colon transverse colon and proximal left colon with gas and stool probable mild ileus. Paucity of bowel gas in distal sigmoid colon and rectum. Contrast material from recent CT scan noted within urinary bladder. No evidence of free abdominal air. IMPRESSION: Normal small bowel gas pattern. Mild distension of the right colon transverse colon and proximal left colon with gas and stool probable colonic ileus. Paucity of bowel gas in distal sigmoid colon and rectum. Electronically Signed   By: Natasha Mead M.D.   On: 06/20/2015 10:33   Dg Abd 2 Views  06/19/2015  CLINICAL DATA:  Periumbilical pain for several hours, history of abdominal wall hernia. EXAM: ABDOMEN - 2 VIEW COMPARISON:  None. FINDINGS: Scattered large and small bowel gas is noted. No abnormal mass or abnormal calcifications are seen. Mild fecal material is noted within the colon. No true obstructive changes are seen. No free air is noted. Mild degenerative changes of the thoracic and lumbar spine are seen. IMPRESSION: No acute abnormality noted. Electronically Signed   By: Alcide Clever M.D.   On: 06/19/2015 17:29    IMPRESSION AND PLAN:  Seth Stafford  is a 55 y.o. male with a known history of Hypertension, sleep apnea using CPAP, RA, type 2 diabetes, morbid obesity comes to the emergency room with abdominal pain for last 3 days. Patient said he started having mid abdominal pain progressively worsening imaging studies found his incarcerated ventral hernia.   1. Abdominal pain progressively worsening secondary to incarcerated inguinal hernia management per surgery  2. Hypertension  -Since patient is  nothing by mouth we will use IV when necessary hydralazine  3. Chronic anticoagulation with warfarin secondary to history of PE Patient received 2 doses of IV vitamin K to revert INR given need for surgery Follow daily INR  4. Type 2 diabetes sliding scale insulin  5. Morbid obesity with history of obstructive sleep apnea continue CPAP use  6. History of rheumatoid arthritis. Patient is on prednisone chronic We'll give stress dose of steroid given patient undergoing surgery start IV 50 mg 3 times a day and taper it off once done with surgery  7. DVT prophylaxis per surgery  Above was discussed with patient and patient's wife. Thank you for the consult will follow along with you All the records are reviewed and case discussed with Consulting provider. Management plans discussed with the patient, family and they are in agreement.  CODE STATUS: full  TOTAL TIME TAKING CARE OF THIS PATIENT: 45 minutes.    Delcia Spitzley M.D on 06/20/2015 at 11:56 AM  Between 7am to 6pm - Pager - [phone]  After 6pm go to [email or web address] - password [email or web address] [facility]  [facility]  Office  [phone]  CC: Primary care Physician: Barbette Reichmann, MD

## 2015-06-20 NOTE — Progress Notes (Signed)
Picc line placed by PICC team awaiting radiology to confirm placement, first unit of FFP given via peripheral line

## 2015-06-21 ENCOUNTER — Inpatient Hospital Stay: Payer: 59 | Admitting: Anesthesiology

## 2015-06-21 ENCOUNTER — Encounter: Payer: Self-pay | Admitting: Anesthesiology

## 2015-06-21 ENCOUNTER — Encounter
Admission: EM | Disposition: A | Discharge status: Home Health | Payer: Self-pay | Source: Home / Self Care | Attending: General Surgery

## 2015-06-21 HISTORY — PX: VENTRAL HERNIA REPAIR: SHX424

## 2015-06-21 LAB — PREPARE FRESH FROZEN PLASMA
UNIT DIVISION: 0
Unit division: 0

## 2015-06-21 LAB — CBC
HCT: 31.2 % — ABNORMAL LOW (ref 40.0–52.0)
HEMOGLOBIN: 10.3 g/dL — AB (ref 13.0–18.0)
MCH: 31.3 pg (ref 26.0–34.0)
MCHC: 33.1 g/dL (ref 32.0–36.0)
MCV: 94.5 fL (ref 80.0–100.0)
Platelets: 259 10*3/uL (ref 150–440)
RBC: 3.31 MIL/uL — AB (ref 4.40–5.90)
RDW: 15.6 % — ABNORMAL HIGH (ref 11.5–14.5)
WBC: 10.6 10*3/uL (ref 3.8–10.6)

## 2015-06-21 LAB — COMPREHENSIVE METABOLIC PANEL
ALK PHOS: 57 U/L (ref 38–126)
ALT: 15 U/L — ABNORMAL LOW (ref 17–63)
ANION GAP: 4 — AB (ref 5–15)
AST: 13 U/L — ABNORMAL LOW (ref 15–41)
Albumin: 3.8 g/dL (ref 3.5–5.0)
BUN: 17 mg/dL (ref 6–20)
CALCIUM: 8.7 mg/dL — AB (ref 8.9–10.3)
CO2: 34 mmol/L — AB (ref 22–32)
Chloride: 100 mmol/L — ABNORMAL LOW (ref 101–111)
Creatinine, Ser: 0.77 mg/dL (ref 0.61–1.24)
GFR calc non Af Amer: >60 mL/min (ref >60)
Glucose, Bld: 175 mg/dL — ABNORMAL HIGH (ref 65–99)
Potassium: 4.5 mmol/L (ref 3.5–5.1)
SODIUM: 138 mmol/L (ref 135–145)
TOTAL PROTEIN: 7.4 g/dL (ref 6.5–8.1)
Total Bilirubin: 1.4 mg/dL — ABNORMAL HIGH (ref 0.3–1.2)

## 2015-06-21 LAB — GLUCOSE, CAPILLARY
GLUCOSE-CAPILLARY: 169 mg/dL — AB (ref 65–99)
GLUCOSE-CAPILLARY: 163 mg/dL — AB (ref 65–99)
GLUCOSE-CAPILLARY: 134 mg/dL — AB (ref 65–99)
Glucose-Capillary: 152 mg/dL — ABNORMAL HIGH (ref 65–99)
Glucose-Capillary: 124 mg/dL — ABNORMAL HIGH (ref 65–99)

## 2015-06-21 LAB — PROTIME-INR
INR: 1.36
PROTHROMBIN TIME: 16.9 s — AB (ref 11.4–15.0)

## 2015-06-21 LAB — MRSA PCR SCREENING: MRSA by PCR: NEGATIVE

## 2015-06-21 LAB — MAGNESIUM: MAGNESIUM: 1.6 mg/dL — AB (ref 1.7–2.4)

## 2015-06-21 SURGERY — REPAIR, HERNIA, VENTRAL
Anesthesia: General | Site: Abdomen | Wound class: Clean

## 2015-06-21 MED ORDER — PROPOFOL 10 MG/ML IV BOLUS
INTRAVENOUS | Status: DC | PRN
  Administered 2015-06-21: 200 mg via INTRAVENOUS

## 2015-06-21 MED ORDER — ENOXAPARIN SODIUM 40 MG/0.4ML ~~LOC~~ SOLN
40.0000 mg | SUBCUTANEOUS | Status: AC
  Administered 2015-06-21: 40 mg via SUBCUTANEOUS
  Filled 2015-06-21: qty 0.4

## 2015-06-21 MED ORDER — PHENYLEPHRINE HCL 10 MG/ML IJ SOLN
INTRAMUSCULAR | Status: DC | PRN
  Administered 2015-06-21 (×3): 200 ug via INTRAVENOUS

## 2015-06-21 MED ORDER — SUCCINYLCHOLINE CHLORIDE 20 MG/ML IJ SOLN
INTRAMUSCULAR | Status: DC | PRN
  Administered 2015-06-21: 140 mg via INTRAVENOUS

## 2015-06-21 MED ORDER — SODIUM CHLORIDE 0.9 % IV SOLN
INTRAVENOUS | Status: DC | PRN
  Administered 2015-06-21: 23:00:00 via INTRAVENOUS

## 2015-06-21 MED ORDER — SODIUM CHLORIDE 0.9 % IV SOLN
10000.0000 ug | INTRAVENOUS | Status: DC | PRN
  Administered 2015-06-21: 35 ug/min via INTRAVENOUS

## 2015-06-21 MED ORDER — ROCURONIUM BROMIDE 100 MG/10ML IV SOLN
INTRAVENOUS | Status: DC | PRN
  Administered 2015-06-21: 40 mg via INTRAVENOUS
  Administered 2015-06-21 – 2015-06-22 (×3): 50 mg via INTRAVENOUS

## 2015-06-21 MED ORDER — FENTANYL CITRATE (PF) 100 MCG/2ML IJ SOLN
INTRAMUSCULAR | Status: DC | PRN
  Administered 2015-06-21 (×4): 50 ug via INTRAVENOUS
  Administered 2015-06-22: 100 ug via INTRAVENOUS

## 2015-06-21 SURGICAL SUPPLY — 46 items
ALLOGRAFT TISSUE 6X12 (Tissue Mesh) ×2 IMPLANT
ALLOGRAFT TISSUE 6X12CM (Tissue Mesh) ×1 IMPLANT
BLADE SURG 15 STRL LF DISP TIS (BLADE) ×1 IMPLANT
BLADE SURG 15 STRL SS (BLADE) ×2
CANISTER SUCT 1200ML W/VALVE (MISCELLANEOUS) ×3 IMPLANT
CHLORAPREP W/TINT 26ML (MISCELLANEOUS) ×3 IMPLANT
CLOSURE WOUND 1/2 X4 (GAUZE/BANDAGES/DRESSINGS) ×1
DRAPE INCISE IOBAN 66X45 STRL (DRAPES) ×3 IMPLANT
DRAPE LAPAROTOMY 100X77 ABD (DRAPES) ×3 IMPLANT
DRAPE SHEET LG 3/4 BI-LAMINATE (DRAPES) ×3 IMPLANT
DRAPE UTILITY 15X26 TOWEL STRL (DRAPES) ×6 IMPLANT
DRSG OPSITE POSTOP 4X10 (GAUZE/BANDAGES/DRESSINGS) ×3 IMPLANT
ELECT CAUTERY BLADE 6.4 (BLADE) ×3 IMPLANT
ELECT REM PT RETURN 9FT ADLT (ELECTROSURGICAL) ×3
ELECTRODE REM PT RTRN 9FT ADLT (ELECTROSURGICAL) ×1 IMPLANT
GAUZE SPONGE 4X4 12PLY STRL (GAUZE/BANDAGES/DRESSINGS) ×6 IMPLANT
GLOVE BIO SURGEON STRL SZ7.5 (GLOVE) ×3 IMPLANT
GOWN STRL REUS W/ TWL LRG LVL3 (GOWN DISPOSABLE) ×1 IMPLANT
GOWN STRL REUS W/ TWL XL LVL3 (GOWN DISPOSABLE) ×1 IMPLANT
GOWN STRL REUS W/TWL LRG LVL3 (GOWN DISPOSABLE) ×2
GOWN STRL REUS W/TWL XL LVL3 (GOWN DISPOSABLE) ×2
JACKSON PRATT 10 (INSTRUMENTS) ×3 IMPLANT
KIT RM TURNOVER STRD PROC AR (KITS) ×3 IMPLANT
LABEL OR SOLS (LABEL) ×3 IMPLANT
LIQUID BAND (GAUZE/BANDAGES/DRESSINGS) ×3 IMPLANT
NEEDLE FILTER BLUNT 18X 1/2SAF (NEEDLE) ×2
NEEDLE FILTER BLUNT 18X1 1/2 (NEEDLE) ×1 IMPLANT
NEEDLE HYPO 25X1 1.5 SAFETY (NEEDLE) ×3 IMPLANT
NS IRRIG 500ML POUR BTL (IV SOLUTION) ×3 IMPLANT
PACK BASIN MINOR ARMC (MISCELLANEOUS) ×3 IMPLANT
SPONGE LAP 18X18 5 PK (GAUZE/BANDAGES/DRESSINGS) ×9 IMPLANT
STAPLER SKIN PROX 35W (STAPLE) ×3 IMPLANT
STRIP CLOSURE SKIN 1/2X4 (GAUZE/BANDAGES/DRESSINGS) ×2 IMPLANT
SUT ETHIBOND 0 (SUTURE) ×12 IMPLANT
SUT ETHILON 2 0 FS 18 (SUTURE) ×3 IMPLANT
SUT MAXON 0 T 12 3 (SUTURE) ×6 IMPLANT
SUT PROLENE 0 CT 1 30 (SUTURE) ×18 IMPLANT
SUT VIC AB 0 UR5 27 (SUTURE) ×12 IMPLANT
SUT VIC AB 2-0 BRD 54 (SUTURE) ×3 IMPLANT
SUT VIC AB 2-0 CT1 (SUTURE) ×18 IMPLANT
SUT VIC AB 3-0 SH 27 (SUTURE) ×2
SUT VIC AB 3-0 SH 27X BRD (SUTURE) ×1 IMPLANT
SUT VICRYL+ 3-0 36IN CT-1 (SUTURE) ×3 IMPLANT
SWABSTK COMLB BENZOIN TINCTURE (MISCELLANEOUS) ×3 IMPLANT
SYR 3ML LL SCALE MARK (SYRINGE) ×3 IMPLANT
SYRINGE 10CC LL (SYRINGE) ×3 IMPLANT

## 2015-06-21 NOTE — Progress Notes (Signed)
2200: Sent to OR emergent; Consent signed; PCR negative; report given to OR staff; family at bedside; patient very anxious; Maiya Kates K 06/21/2015

## 2015-06-21 NOTE — Anesthesia Procedure Notes (Addendum)
Procedure Name: Intubation Date/Time: 06/21/2015 10:48 PM Performed by: Waldo Laine Pre-anesthesia Checklist: Patient identified, Emergency Drugs available, Suction available, Patient being monitored and Timeout performed Patient Re-evaluated:Patient Re-evaluated prior to inductionOxygen Delivery Method: Circle system utilized Preoxygenation: Pre-oxygenation with 100% oxygen Intubation Type: IV induction and Cricoid Pressure applied Laryngoscope Size: Glidescope and 4 Grade View: Grade I Tube type: Oral Tube size: 7.0 mm Number of attempts: 1 Airway Equipment and Method: Patient positioned with wedge pillow and Stylet Placement Confirmation: ETT inserted through vocal cords under direct vision and positive ETCO2 Secured at: 23 cm Tube secured with: Tape Dental Injury: Teeth and Oropharynx as per pre-operative assessment  Difficulty Due To: Difficulty was anticipated Future Recommendations: Recommend- induction with short-acting agent, and alternative techniques readily available Comments:   Arterial Line Placement:  Date: 06/21/2015 Time: 11:26 PM  A time-out was completed verifying correct patient, procedure, site, positioning, and special equipment if applicable.   Allen's test was performed to ensure adequate perfusion. The patient's left wrist was prepped and draped in sterile fashion.  A 20 G Arrow arterial line was introduced into the radial artery. The catheter was threaded over the guide wire and the needle was removed with appropriate pulsatile blood return. The catheter was then secured with a sterile Tegaderm dressing. Perfusion to the extremity distal to the point of catheter insertion was checked and found to be adequate.  Estimated Blood Loss: minimal  The patient tolerated the procedure well and there were no immediate complications

## 2015-06-21 NOTE — Progress Notes (Signed)
Seth Stafford is a 55 y.o. male  admitted 48 hours ago with an incarcerated ventral hernia.  HPI: He developed significant abdominal pain in his umbilical area on May 12. He has had a known umbilical hernia in that area developed sudden onset of significant mass in the supraumbilical area which was quite tender. It increasing abdominal pain over the course of that evening presented the emergency room for further evaluation the following day. He was admitted to Hospital on the evening of May 13. CT scan at that time demonstrated what appeared to be an incarcerated ventral hernia with loop of sigmoid colon in the hernia. There was question of possible ischemic colitis versus diverticulitis.  The patient's had multiple medical problems in the past including severe chronic obstructive lung disease. He has no smoking history. He had a pulmonary embolus 15+ years ago and has been on chronic anticoagulation. He is morbidly obese with history of diabetes and hypertension. His only previous surgery was an appendectomy performed when he was child. He's had no other abdominal surgery.  He was admitted to the surgical service on leaning the 13th. He is place and antibiotic therapy and anticoagulation reversal. There was an attempt to transfer him to a tertiary care center with his multiple medical problems but the transfer has not been accomplished. The center requested would not accept him in transfer for this problem.  Past Medical History  Diagnosis Date  . Hypertension   . Diabetes mellitus without complication (HCC)   . RA (rheumatoid arthritis) (HCC)   . Sleep apnea   . CPAP (continuous positive airway pressure) dependence   . On home oxygen therapy   . Morbid (severe) obesity due to excess calories (HCC)   . PE (pulmonary embolism) 2001  . Diverticulosis   . Collagen vascular disease (HCC)    History reviewed. No pertinent past surgical history. Social History   Social History  . Marital Status:  Married    Spouse Name: N/A  . Number of Children: N/A  . Years of Education: N/A   Social History Main Topics  . Smoking status: Never Smoker   . Smokeless tobacco: None  . Alcohol Use: No  . Drug Use: No  . Sexual Activity: Not Asked   Other Topics Concern  . None   Social History Narrative    Review of Systems: Review of Systems  Constitutional: Negative for fever, chills and weight loss.  HENT: Negative.   Eyes: Negative.   Respiratory: Positive for shortness of breath. Negative for cough, hemoptysis, sputum production and wheezing.   Cardiovascular: Positive for leg swelling. Negative for chest pain and palpitations.  Gastrointestinal: Positive for abdominal pain. Negative for nausea and vomiting.  Genitourinary: Negative.   Musculoskeletal: Negative.   Neurological: Negative.   Psychiatric/Behavioral: Negative.     PHYSICAL EXAM: BP 152/63 mmHg  Pulse 116  Temp(Src) 98 F (36.7 C) (Oral)  Resp 18  Ht 6' (1.829 m)  Wt 177.084 kg (390 lb 6.4 oz)  BMI 52.94 kg/m2  SpO2 100%  Physical Exam  Constitutional: He is oriented to person, place, and time.  Morbidly obese  HENT:  Head: Normocephalic and atraumatic.  Eyes: EOM are normal. Pupils are equal, round, and reactive to light.  Neck: Normal range of motion. Neck supple.  Cardiovascular: Normal rate and regular rhythm.   Pulmonary/Chest: Effort normal.  He is oxygen dependent but has no obvious shortness of breath or increased work of respiration on examination.  Musculoskeletal: Normal range  of motion. He exhibits edema. He exhibits no tenderness.  Neurological: He is alert and oriented to person, place, and time.  Skin: Skin is warm and dry.  Psychiatric: He has a normal mood and affect. Thought content normal.   His abdomen demonstrates a large firm erythematous mass just above his umbilicus. It is tender to touch and will not reduce. He has marked induration and increasing erythema. He does have  hypoactive but present bowel sounds with some tinkling bowel sounds noted. He does not have any other hernias noted.  Impression/Plan: I have independently reviewed his CT scan and discussed situation with the patient in detail. TRANSFER were unsuccessful today. This gentleman has now been 48 hours or more with an incarcerated ventral hernia demonstrating inflammatory changes on CT scan. Since his surgery is now not reducible and continues to develop increasing discomfort and erythema I'm concerned about the possibility of ischemic bowel. He is a very poor surgical candidate with his multiple medical problems but I think in this situation there is no alternative other than surgical intervention. I discussed surgery with him including the possibility of colostomy. Because of the inflammatory change and may be able to do only a temporary closure of his hernia. However we need to prove the does not have ischemic bowel at this time.  I have discussed with him the possibility of the will require prolonged postoperative ventilator support. With his chronic lung disease and severe obesity he is a difficult surgical candidate. However in this situation I really think he has no other alternative. He is in agreement with this plan.   Tiney Rouge III, MD  06/21/2015, 8:43 PM

## 2015-06-21 NOTE — Anesthesia Preprocedure Evaluation (Signed)
Anesthesia Evaluation  Patient identified by MRN, date of birth, ID band Patient awake    Reviewed: Allergy & Precautions, H&P , NPO status , Patient's Chart, lab work & pertinent test results  History of Anesthesia Complications Negative for: history of anesthetic complications  Airway Mallampati: III  TM Distance: >3 FB Neck ROM: full    Dental  (+) Poor Dentition, Chipped   Pulmonary shortness of breath, with exertion, at rest and Long-Term Oxygen Therapy, sleep apnea , COPD,    Pulmonary exam normal breath sounds clear to auscultation       Cardiovascular Exercise Tolerance: Poor hypertension, (-) angina+ DOE  (-) Past MI Normal cardiovascular exam Rhythm:regular Rate:Normal     Neuro/Psych negative neurological ROS  negative psych ROS   GI/Hepatic negative GI ROS, Neg liver ROS,   Endo/Other  diabetes, Type 2  Renal/GU negative Renal ROS  negative genitourinary   Musculoskeletal  (+) Arthritis ,   Abdominal   Peds  Hematology negative hematology ROS (+)   Anesthesia Other Findings Past Medical History:   Hypertension                                                 Diabetes mellitus without complication (HCC)                 RA (rheumatoid arthritis) (HCC)                              Sleep apnea                                                  CPAP (continuous positive airway pressure) dep*              On home oxygen therapy                                       Morbid (severe) obesity due to excess calories*              PE (pulmonary embolism)                         2001         Diverticulosis                                               Collagen vascular disease (HCC)                             History reviewed. No pertinent surgical history.  BMI    Body Mass Index   52.93 kg/m 2      Reproductive/Obstetrics negative OB ROS                             Anesthesia  Physical Anesthesia Plan  ASA: IV and emergent  Anesthesia Plan: General ETT   Post-op Pain Management:    Induction: Intravenous  Airway Management Planned: Video Laryngoscope Planned  Additional Equipment:   Intra-op Plan:   Post-operative Plan: Possible Post-op intubation/ventilation  Informed Consent: I have reviewed the patients History and Physical, chart, labs and discussed the procedure including the risks, benefits and alternatives for the proposed anesthesia with the patient or authorized representative who has indicated his/her understanding and acceptance.   Dental Advisory Given  Plan Discussed with: Anesthesiologist, CRNA and Surgeon  Anesthesia Plan Comments: (Patient informed that they are higher risk for complications from anesthesia during this procedure due to their medical history including but not limited to post operative ventilation.  Patient voiced understanding. )        Anesthesia Quick Evaluation

## 2015-06-21 NOTE — Progress Notes (Signed)
Patient ID: Seth Stafford, male   DOB: 06-14-1960, 55 y.o.   MRN: [id number] [name]   PATIENT NAME: Seth Stafford    MR#:  [id number]  DATE OF BIRTH:  12/23/1960  SUBJECTIVE:  On-and-off intermittent abdominal pain. Vicodin the room. Patient will be discharging to a tertiary care center for his surgery given the complexity off surgery and medical problems.  REVIEW OF SYSTEMS:   Review of Systems  Constitutional: Negative for fever, chills and weight loss.  HENT: Negative for ear discharge, ear pain and nosebleeds.   Eyes: Negative for blurred vision, pain and discharge.  Respiratory: Negative for sputum production, shortness of breath, wheezing and stridor.   Cardiovascular: Negative for chest pain, palpitations, orthopnea and PND.  Gastrointestinal: Positive for abdominal pain. Negative for nausea, vomiting and diarrhea.  Genitourinary: Negative for urgency and frequency.  Musculoskeletal: Negative for back pain and joint pain.  Neurological: Positive for weakness. Negative for sensory change, speech change and focal weakness.  Psychiatric/Behavioral: Negative for depression and hallucinations. The patient is not nervous/anxious.   All other systems reviewed and are negative.  Tolerating Diet: Nothing by mouth Tolerating PT: Pending  DRUG ALLERGIES:   Allergies  Allergen Reactions  . Remicade [Infliximab] Anaphylaxis    VITALS:  Blood pressure 153/64, pulse 88, temperature 98 F (36.7 C), temperature source Oral, resp. rate 18, height 6' (1.829 m), weight 177.084 kg (390 lb 6.4 oz), SpO2 100 %.  PHYSICAL EXAMINATION:   Physical Exam  GENERAL:  55 y.o.-year-old patient lying in the bed with no acute distress. Morbidly obese EYES: Pupils equal, round, reactive to light and accommodation. No scleral icterus. Extraocular muscles intact.  HEENT: Head atraumatic, normocephalic. Oropharynx and nasopharynx clear.  NECK:   Supple, no jugular venous distention. No thyroid enlargement, no tenderness.  LUNGS: Normal breath sounds bilaterally, no wheezing, rales, rhonchi. No use of accessory muscles of respiration.  CARDIOVASCULAR: S1, S2 normal. No murmurs, rubs, or gallops.  ABDOMEN: Soft, diffuse tenderness +, nondistended. Bowel sounds present. No organomegaly or mass.  EXTREMITIES: No cyanosis, clubbing or edema b/l.    NEUROLOGIC: Cranial nerves II through XII are intact. No focal Motor or sensory deficits b/l.   PSYCHIATRIC:  patient is alert and oriented x 3.  SKIN: No obvious rash, lesion, or ulcer.   LABORATORY PANEL:  CBC  Recent Labs Lab 06/21/15 0450  WBC 10.6  HGB 10.3*  HCT 31.2*  PLT 259    Chemistries   Recent Labs Lab 06/21/15 0450  NA 138  K 4.5  CL 100*  CO2 34*  GLUCOSE 175*  BUN 17  CREATININE 0.77  CALCIUM 8.7*  MG 1.6*  AST 13*  ALT 15*  ALKPHOS 57  BILITOT 1.4*   Cardiac Enzymes  Recent Labs Lab 06/19/15 1342  TROPONINI <0.03   RADIOLOGY:  Dg Chest 2 View  06/19/2015  CLINICAL DATA:  Chest pain beginning this morning. 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 EXAM: CHEST  2 VIEW COMPARISON:  None. FINDINGS: Moderate elevation of the right hemidiaphragm. Subtle linear density adjacent the elevated right hemidiaphragm likely atelectasis. Minimal prominence of the left perihilar markings which may be due to mild vascular congestion. No evidence of effusion. Cardiac silhouette difficult to evaluate due to the elevated right hemidiaphragm. Degenerative change of the spine. IMPRESSION: Suggestion of minimal vascular congestion. Moderate elevation of the right hemidiaphragm with minimal associated right base atelectasis. Electronically Signed   By: Reuel Boom  Micheline Maze M.D.   On: 06/19/2015 15:08   Ct Abdomen Pelvis W Contrast  06/19/2015  CLINICAL DATA:  55 year old male with ventral hernia and abdominal pain. EXAM: CT ABDOMEN AND PELVIS WITH CONTRAST TECHNIQUE:  Multidetector CT imaging of the abdomen and pelvis was performed using the standard protocol following bolus administration of intravenous contrast. CONTRAST:  ISOVUE-300 IOPAMIDOL (ISOVUE-300) INJECTION 61% COMPARISON:  None. FINDINGS: Evaluation is limited due to streak artifact caused by patient's arms. The visualized left lung base is clear. There is coronary vascular calcification. No intra-abdominal free air or free fluid identified. Multiple stones noted within the gallbladder. No pericholecystic fluid. Ultrasound may provide better evaluation of the gallbladder. The liver, pancreas, spleen, adrenal glands, kidneys, visualized ureters, and urinary bladder appear unremarkable. The prostate and seminal vesicles are grossly unremarkable. There is a 5 cm defect in the midline anterior abdominal wall at the level of the umbilicus with herniation of the mesenteric fat. There is also herniation of a segment of the sigmoid colon within this hernia. There is inflammatory changes of the herniated segment of the sigmoid colon with stranding of the herniated fat. Small amount of fluid noted within the hernia sac. Inflammatory changes of the sigmoid colon within the hernia sac likely represent acute diverticulitis and are less likely related to strangulation of the bowel as the hernia has a wide neck and there is no evidence of bowel dilatation. No pneumatosis. Oral contrast opacifies the stomach and multiple loops of small bowel. There is no evidence of small-bowel obstruction. The appendix is not visualized with certainty. No inflammatory changes identified in the right lower quadrant. The abdominal aorta and IVC appear unremarkable. No portal venous gas identified. There is no adenopathy. There is skin thickening and stranding of the subcutaneous fat in the anterior pelvic wall. Mild degenerative changes of the spine. No acute fracture. IMPRESSION: Midline lower abdominal ventral or paraumbilical hernia defect  containing a segment of the sigmoid colon. There is inflammatory changes of the sigmoid colon within the hernia sac likely representing acute diverticulitis. Strangulation of the herniated bowel is less likely. Clinical correlation is recommended. No drainable fluid collection. No free air. Electronically Signed   By: Elgie Collard M.D.   On: 06/19/2015 19:07   Dg Chest Port 1 View  06/20/2015  CLINICAL DATA:  55 year old male status post central line placement. EXAM: PORTABLE CHEST 1 VIEW COMPARISON:  Chest x-ray 06/19/2015. FINDINGS: There is a right upper extremity PICC with tip terminating in the mid superior vena cava. Marked elevation of the right hemidiaphragm. Associated passive atelectasis throughout the base of the right lung. Left lung appears clear. Possible small right pleural effusion. No evidence of pulmonary edema. Heart size appears normal. The patient is rotated to the right on today's exam, resulting in distortion of the mediastinal contours and reduced diagnostic sensitivity and specificity for mediastinal pathology. IMPRESSION: 1. Tip of right upper extremity PICC is in the mid superior vena cava. 2. Persistent marked elevation of the right hemidiaphragm with associated passive atelectasis in the base of the right lung and probable small right pleural effusion. Electronically Signed   By: Trudie Reed M.D.   On: 06/20/2015 15:05   Dg Abd 2 Views  06/20/2015  CLINICAL DATA:  Abdominal pain, no bowel movement for 3 days EXAM: ABDOMEN - 2 VIEW COMPARISON:  5/13/ 17 FINDINGS: There is normal small bowel gas pattern. Mild distension of the right colon transverse colon and proximal left colon with gas and stool probable  mild ileus. Paucity of bowel gas in distal sigmoid colon and rectum. Contrast material from recent CT scan noted within urinary bladder. No evidence of free abdominal air. IMPRESSION: Normal small bowel gas pattern. Mild distension of the right colon transverse colon and  proximal left colon with gas and stool probable colonic ileus. Paucity of bowel gas in distal sigmoid colon and rectum. Electronically Signed   By: Natasha Mead M.D.   On: 06/20/2015 10:33   Dg Abd 2 Views  06/19/2015  CLINICAL DATA:  Periumbilical pain for several hours, history of abdominal wall hernia. EXAM: ABDOMEN - 2 VIEW COMPARISON:  None. FINDINGS: Scattered large and small bowel gas is noted. No abnormal mass or abnormal calcifications are seen. Mild fecal material is noted within the colon. No true obstructive changes are seen. No free air is noted. Mild degenerative changes of the thoracic and lumbar spine are seen. IMPRESSION: No acute abnormality noted. Electronically Signed   By: Alcide Clever M.D.   On: 06/19/2015 17:29   ASSESSMENT AND PLAN:  Kincade Granberg is a 55 y.o. male with a known history of Hypertension, sleep apnea using CPAP, RA, type 2 diabetes, morbid obesity comes to the emergency room with abdominal pain for last 3 days. Patient said he started having mid abdominal pain progressively worsening imaging studies found his incarcerated ventral hernia.   1. Abdominal pain progressively worsening secondary to incarcerated inguinal hernia management per surgery Patient likely is currently transferred to [facility] given complexity of surgery  2. Hypertension  -Since patient is nothing by mouth we will use IV when necessary hydralazine  3. Chronic anticoagulation with warfarin secondary to history of PE Patient received 2 doses of IV vitamin K to revert INR given need for surgery INR down to 1.3  4. Type 2 diabetes sliding scale insulin  5. Morbid obesity with history of obstructive sleep apnea continue CPAP use  6. History of rheumatoid arthritis. Patient is on prednisone chronic We'll give stress dose of steroid given patient undergoing surgery start IV 50 mg 3 times a day and taper it off once done with surgery  7. DVT prophylaxis per  surgery  Discussed with patient and wife. They're agreeable for tertiary care center transfer. Transfer will be taken care of by surgery  We'll sign off. Call us if needed. Case discussed with Care Management/Social Worker. Management plans discussed with the patient, family and they are in agreement.  CODE STATUS: Full  DVT Prophylaxis: SCD teds  TOTAL TIME TAKING CARE OF THIS PATIENT: 25 minutes.  >50% time spent on counselling and coordination of care    Note: This dictation was prepared with Dragon dictation along with smaller phrase technology. Any transcriptional errors that result from this process are unintentional.  Elishia Kaczorowski M.D on 06/21/2015 at 1:54 PM  Between 7am to 6pm - Pager - [phone]  After 6pm go to [email or web address] - password [email or web address] [email or web address]  [facility]  Office  [phone]  CC: Primary care physician; Barbette Reichmann, MD

## 2015-06-21 NOTE — Clinical Documentation Improvement (Signed)
Internal Medicine  Patient is Oxygen Dependent with Morbid Obesity with BMI of 53, history of COPD. Please provide a diagnosis associated with the patient's Oxygen Dependence and treatment provided and document findings in next progress note. Thank you!   Document Acuity - Acute, Chronic, Acute on Chronic  Document Inclusion Of - Hypoxia, Hypercapnia, Combination of Both  Other  Clinically Undetermined  Document any associated diagnoses/conditions.  Supporting Information:  Patient supported with 2L, 3L FiO2 for current admission  Please exercise your independent, professional judgment when responding. A specific answer is not anticipated or expected.  Thank You,  Shellee Milo RN, BSN, CCDS Health Information Management [place] [phone]; Cell: [phone]

## 2015-06-21 NOTE — Progress Notes (Signed)
Tolerated FFP X4; Dr. Tonita Cong notified of INR of 1.36 this am; Patient voiced concern regarding upcoming hernia surgery and willingness to be transferred to [facility]; Windy Carina, RN 6:07 AM 06/21/2015

## 2015-06-22 ENCOUNTER — Encounter: Payer: Self-pay | Admitting: Surgery

## 2015-06-22 DIAGNOSIS — J9600 Acute respiratory failure, unspecified whether with hypoxia or hypercapnia: Secondary | ICD-10-CM

## 2015-06-22 DIAGNOSIS — R000 Tachycardia, unspecified: Secondary | ICD-10-CM

## 2015-06-22 DIAGNOSIS — E118 Type 2 diabetes mellitus with unspecified complications: Secondary | ICD-10-CM

## 2015-06-22 LAB — BASIC METABOLIC PANEL
ANION GAP: 9 (ref 5–15)
BUN: 19 mg/dL (ref 6–20)
CALCIUM: 8.1 mg/dL — AB (ref 8.9–10.3)
CO2: 27 mmol/L (ref 22–32)
Chloride: 103 mmol/L (ref 101–111)
Creatinine, Ser: 0.94 mg/dL (ref 0.61–1.24)
GLUCOSE: 207 mg/dL — AB (ref 65–99)
POTASSIUM: 4.5 mmol/L (ref 3.5–5.1)
SODIUM: 139 mmol/L (ref 135–145)

## 2015-06-22 LAB — GLUCOSE, CAPILLARY
GLUCOSE-CAPILLARY: 161 mg/dL — AB (ref 65–99)
GLUCOSE-CAPILLARY: 170 mg/dL — AB (ref 65–99)
GLUCOSE-CAPILLARY: 213 mg/dL — AB (ref 65–99)
GLUCOSE-CAPILLARY: 224 mg/dL — AB (ref 65–99)
GLUCOSE-CAPILLARY: 231 mg/dL — AB (ref 65–99)
Glucose-Capillary: 247 mg/dL — ABNORMAL HIGH (ref 65–99)
Glucose-Capillary: 315 mg/dL — ABNORMAL HIGH (ref 65–99)

## 2015-06-22 LAB — CBC
HEMATOCRIT: 31.1 % — AB (ref 40.0–52.0)
HEMOGLOBIN: 10.1 g/dL — AB (ref 13.0–18.0)
MCH: 30.6 pg (ref 26.0–34.0)
MCHC: 32.6 g/dL (ref 32.0–36.0)
MCV: 93.9 fL (ref 80.0–100.0)
Platelets: 328 10*3/uL (ref 150–440)
RBC: 3.31 MIL/uL — AB (ref 4.40–5.90)
RDW: 16.1 % — ABNORMAL HIGH (ref 11.5–14.5)
WBC: 18.7 10*3/uL — AB (ref 3.8–10.6)

## 2015-06-22 MED ORDER — ENOXAPARIN SODIUM 40 MG/0.4ML ~~LOC~~ SOLN: 40.0000 mg | SUBCUTANEOUS | Status: DC

## 2015-06-22 MED ORDER — ENOXAPARIN SODIUM 40 MG/0.4ML ~~LOC~~ SOLN
40.0000 mg | Freq: Two times a day (BID) | SUBCUTANEOUS | Status: DC
  Administered 2015-06-22 – 2015-06-23 (×3): 40 mg via SUBCUTANEOUS
  Filled 2015-06-22 (×3): qty 0.4

## 2015-06-22 MED ORDER — ACETAMINOPHEN 325 MG PO TABS
650.0000 mg | ORAL_TABLET | Freq: Four times a day (QID) | ORAL | Status: DC
  Administered 2015-06-22 – 2015-06-27 (×21): 650 mg via ORAL
  Filled 2015-06-22 (×21): qty 2

## 2015-06-22 MED ORDER — SODIUM CHLORIDE 0.9 % IV SOLN
INTRAVENOUS | Status: DC | PRN
  Administered 2015-06-22: via INTRAVENOUS

## 2015-06-22 MED ORDER — ADULT MULTIVITAMIN W/MINERALS CH
1.0000 | ORAL_TABLET | Freq: Every day | ORAL | Status: DC
  Administered 2015-06-22 – 2015-06-27 (×6): 1 via ORAL
  Filled 2015-06-22 (×6): qty 1

## 2015-06-22 MED ORDER — SODIUM CHLORIDE 0.9 % IV BOLUS (SEPSIS)
1000.0000 mL | Freq: Once | INTRAVENOUS | Status: AC
  Administered 2015-06-22: 1000 mL via INTRAVENOUS

## 2015-06-22 MED ORDER — METOPROLOL TARTRATE 25 MG PO TABS
25.0000 mg | ORAL_TABLET | Freq: Two times a day (BID) | ORAL | Status: DC
  Administered 2015-06-22 – 2015-06-23 (×3): 25 mg via ORAL
  Filled 2015-06-22 (×3): qty 1

## 2015-06-22 MED ORDER — MIDAZOLAM HCL 2 MG/2ML IJ SOLN
2.0000 mg | INTRAMUSCULAR | Status: DC | PRN
  Administered 2015-06-22 (×2): 2 mg via INTRAVENOUS

## 2015-06-22 MED ORDER — METOPROLOL TARTRATE 5 MG/5ML IV SOLN
2.5000 mg | INTRAVENOUS | Status: DC | PRN
  Administered 2015-06-22: 5 mg via INTRAVENOUS
  Filled 2015-06-22: qty 5

## 2015-06-22 MED ORDER — FENTANYL BOLUS VIA INFUSION
50.0000 ug | INTRAVENOUS | Status: DC | PRN
  Filled 2015-06-22: qty 50

## 2015-06-22 MED ORDER — LACTATED RINGERS IV SOLN
INTRAVENOUS | Status: DC
  Administered 2015-06-23 – 2015-06-24 (×3): via INTRAVENOUS

## 2015-06-22 MED ORDER — FENTANYL CITRATE (PF) 100 MCG/2ML IJ SOLN
50.0000 ug | Freq: Once | INTRAMUSCULAR | Status: AC
  Administered 2015-06-22: 50 ug via INTRAVENOUS
  Filled 2015-06-22: qty 2

## 2015-06-22 MED ORDER — FENTANYL 2500MCG IN NS 250ML (10MCG/ML) PREMIX INFUSION
INTRAVENOUS | Status: AC
  Filled 2015-06-22: qty 250

## 2015-06-22 MED ORDER — WARFARIN - PHARMACIST DOSING INPATIENT
Freq: Every day | Status: DC
  Administered 2015-06-25 – 2015-06-26 (×2)

## 2015-06-22 MED ORDER — SODIUM CHLORIDE 0.9 % IV BOLUS (SEPSIS): 1000.0000 mL | Freq: Once | INTRAVENOUS | Status: DC

## 2015-06-22 MED ORDER — HYDROCORTISONE NA SUCCINATE PF 100 MG IJ SOLR
50.0000 mg | Freq: Two times a day (BID) | INTRAMUSCULAR | Status: DC
  Administered 2015-06-22 – 2015-06-23 (×3): 50 mg via INTRAVENOUS
  Filled 2015-06-22 (×3): qty 2

## 2015-06-22 MED ORDER — INSULIN ASPART 100 UNIT/ML ~~LOC~~ SOLN
0.0000 [IU] | Freq: Three times a day (TID) | SUBCUTANEOUS | Status: DC
  Administered 2015-06-22 – 2015-06-23 (×3): 5 [IU] via SUBCUTANEOUS
  Administered 2015-06-23: 8 [IU] via SUBCUTANEOUS
  Administered 2015-06-23: 2 [IU] via SUBCUTANEOUS
  Administered 2015-06-24: 5 [IU] via SUBCUTANEOUS
  Administered 2015-06-24: 3 [IU] via SUBCUTANEOUS
  Administered 2015-06-24: 5 [IU] via SUBCUTANEOUS
  Administered 2015-06-25: 3 [IU] via SUBCUTANEOUS
  Administered 2015-06-25: 5 [IU] via SUBCUTANEOUS
  Administered 2015-06-25: 3 [IU] via SUBCUTANEOUS
  Administered 2015-06-26 (×3): 5 [IU] via SUBCUTANEOUS
  Administered 2015-06-27: 3 [IU] via SUBCUTANEOUS
  Administered 2015-06-27: 5 [IU] via SUBCUTANEOUS
  Filled 2015-06-22: qty 3
  Filled 2015-06-22 (×3): qty 5
  Filled 2015-06-22: qty 3
  Filled 2015-06-22 (×2): qty 5
  Filled 2015-06-22: qty 8
  Filled 2015-06-22 (×2): qty 3
  Filled 2015-06-22 (×6): qty 5
  Filled 2015-06-22: qty 2

## 2015-06-22 MED ORDER — MIDAZOLAM HCL 2 MG/2ML IJ SOLN
2.0000 mg | INTRAMUSCULAR | Status: DC | PRN
  Filled 2015-06-22 (×2): qty 2

## 2015-06-22 MED ORDER — KETOROLAC TROMETHAMINE 30 MG/ML IJ SOLN
30.0000 mg | Freq: Four times a day (QID) | INTRAMUSCULAR | Status: DC
  Administered 2015-06-22 – 2015-06-23 (×5): 30 mg via INTRAVENOUS
  Filled 2015-06-22 (×8): qty 1

## 2015-06-22 MED ORDER — ALLOPURINOL 100 MG PO TABS
100.0000 mg | ORAL_TABLET | ORAL | Status: DC
  Administered 2015-06-23 – 2015-06-27 (×5): 100 mg via ORAL
  Filled 2015-06-22 (×5): qty 1

## 2015-06-22 MED ORDER — FENTANYL 2500MCG IN NS 250ML (10MCG/ML) PREMIX INFUSION
40.0000 ug/h | INTRAVENOUS | Status: DC
  Administered 2015-06-22: 25 ug/h via INTRAVENOUS

## 2015-06-22 MED ORDER — FENTANYL CITRATE (PF) 100 MCG/2ML IJ SOLN: 50.0000 ug | Freq: Once | INTRAMUSCULAR | Status: DC

## 2015-06-22 MED ORDER — MIDAZOLAM HCL 2 MG/2ML IJ SOLN
INTRAMUSCULAR | Status: AC
  Administered 2015-06-22: 03:00:00
  Filled 2015-06-22: qty 2

## 2015-06-22 MED ORDER — ENOXAPARIN SODIUM 40 MG/0.4ML ~~LOC~~ SOLN: 40.0000 mg | Freq: Two times a day (BID) | SUBCUTANEOUS | Status: DC

## 2015-06-22 MED ORDER — FENTANYL 2500MCG IN NS 250ML (10MCG/ML) PREMIX INFUSION: 25.0000 ug/h | INTRAVENOUS | Status: DC

## 2015-06-22 MED ORDER — FOLIC ACID 1 MG PO TABS
3.0000 mg | ORAL_TABLET | Freq: Every day | ORAL | Status: DC
  Administered 2015-06-22 – 2015-06-27 (×6): 3 mg via ORAL
  Filled 2015-06-22 (×6): qty 3

## 2015-06-22 MED ORDER — PIPERACILLIN-TAZOBACTAM 4.5 G IVPB
4.5000 g | Freq: Three times a day (TID) | INTRAVENOUS | Status: DC
  Administered 2015-06-22 – 2015-06-23 (×4): 4.5 g via INTRAVENOUS
  Filled 2015-06-22 (×8): qty 100

## 2015-06-22 MED ORDER — CYCLOBENZAPRINE HCL 10 MG PO TABS
10.0000 mg | ORAL_TABLET | Freq: Three times a day (TID) | ORAL | Status: DC
  Administered 2015-06-22 – 2015-06-27 (×16): 10 mg via ORAL
  Filled 2015-06-22 (×17): qty 1

## 2015-06-22 MED ORDER — MIDAZOLAM HCL 2 MG/2ML IJ SOLN
INTRAMUSCULAR | Status: DC | PRN
Start: 2015-06-22 — End: 2015-06-22
  Administered 2015-06-22 (×2): 2 mg via INTRAVENOUS

## 2015-06-22 MED ORDER — INSULIN ASPART 100 UNIT/ML ~~LOC~~ SOLN
0.0000 [IU] | Freq: Every day | SUBCUTANEOUS | Status: DC
  Administered 2015-06-22: 4 [IU] via SUBCUTANEOUS
  Administered 2015-06-26: 2 [IU] via SUBCUTANEOUS
  Filled 2015-06-22: qty 1
  Filled 2015-06-22: qty 4

## 2015-06-22 MED ORDER — MIDAZOLAM HCL 2 MG/2ML IJ SOLN: 2.0000 mg | INTRAMUSCULAR | Status: DC | PRN

## 2015-06-22 MED ORDER — HYDROXYCHLOROQUINE SULFATE 200 MG PO TABS
200.0000 mg | ORAL_TABLET | Freq: Two times a day (BID) | ORAL | Status: DC
  Administered 2015-06-22 – 2015-06-25 (×5): 200 mg via ORAL
  Filled 2015-06-22 (×11): qty 1

## 2015-06-22 MED ORDER — WARFARIN SODIUM 5 MG PO TABS
7.0000 mg | ORAL_TABLET | Freq: Every day | ORAL | Status: DC
  Administered 2015-06-23 – 2015-06-26 (×4): 7 mg via ORAL
  Filled 2015-06-22 (×3): qty 1
  Filled 2015-06-22: qty 7

## 2015-06-22 MED ORDER — HYDROMORPHONE HCL 1 MG/ML IJ SOLN
1.0000 mg | INTRAMUSCULAR | Status: DC | PRN
  Administered 2015-06-22: 1 mg via INTRAVENOUS
  Filled 2015-06-22: qty 1

## 2015-06-22 MED ORDER — HYDROMORPHONE HCL 1 MG/ML IJ SOLN: 1.0000 mg | INTRAMUSCULAR | Status: DC | PRN

## 2015-06-22 MED ORDER — LACTATED RINGERS IV SOLN
INTRAVENOUS | Status: DC
  Administered 2015-06-22: 09:00:00 via INTRAVENOUS

## 2015-06-22 MED ORDER — OXYCODONE HCL 5 MG PO TABS: 10.0000 mg | ORAL_TABLET | ORAL | Status: DC | PRN

## 2015-06-22 NOTE — Progress Notes (Signed)
Extubated without complications to 4lnc 

## 2015-06-22 NOTE — Progress Notes (Signed)
55 yr old male with multiple commodities POD#1 from Open ventral hernia repair last night for incarcerated sigmoid colon in hernia.  Patient was left intubated postoperatively but was awake and oriented today, writing on tablet.  Now extubated doing well.  He states some soreness in abdomen but doing well.  Patient would like to get out of bed today.   Filed Vitals:   06/22/15 0830 06/22/15 0900  BP: 114/75 123/106  Pulse: 149 118  Temp:    Resp: 27 27   I/O last 3 completed shifts: In: 12 [I.V.:3798; Blood:745; IV Piggyback:264] Out: 1625 [Urine:1255; Drains:20; Blood:350] Total I/O In: 23.8 [I.V.:23.8] Out: -   PE:   Gen: NAD Res: Crackles in bases, but otherwise clear Cardio: Tachycardic, regular rhythm Abd: soft, incision clean and dry with staples in place, JP drain serosangionous Ext: soft, well perfused  CBC Latest Ref Rng 06/22/2015 06/21/2015 06/20/2015  WBC 3.8 - 10.6 K/uL 18.7(H) 10.6 10.0  Hemoglobin 13.0 - 18.0 g/dL 10.1(L) 10.3(L) 11.8(L)  Hematocrit 40.0 - 52.0 % 31.1(L) 31.2(L) 35.9(L)  Platelets 150 - 440 K/uL 328 259 285   CMP Latest Ref Rng 06/22/2015 06/21/2015 06/20/2015  Glucose 65 - 99 mg/dL 865(H) 846(N) 629(B)  BUN 6 - 20 mg/dL 19 17 28(U)  Creatinine 0.61 - 1.24 mg/dL 1.32 4.40 1.02  Sodium 135 - 145 mmol/L 139 138 135  Potassium 3.5 - 5.1 mmol/L 4.5 4.5 5.2(H)  Chloride 101 - 111 mmol/L 103 100(L) 98(L)  CO2 22 - 32 mmol/L 27 34(H) 31  Calcium 8.9 - 10.3 mg/dL 8.1(L) 8.7(L) 8.9  Total Protein 6.5 - 8.1 g/dL - 7.4 -  Total Bilirubin 0.3 - 1.2 mg/dL - 7.2(Z) -  Alkaline Phos 38 - 126 U/L - 57 -  AST 15 - 41 U/L - 13(L) -  ALT 17 - 63 U/L - 15(L) -    A/P:  55 yr old male with multiple commodities POD#1 from Open ventral hernia repair last night for incarcerated sigmoid colon in hernia  Pain: scheduled tylenol, toradol, and flexeril with prn oxycodone and diluadid iv as needed Res: good pulm toilet, continue CPAP at night for sleep apnea Cardio:  tachycardic, placed on metoprolol by critical care team GI: doing well, will give him clear liquids today, Protonix for GI px GU: foley in place, good UOP, will d/c today Endo: DM, continue SSI, chronic steriods for arthritis: he was placed on stress dose after OR, moved to q 12 today Hem/ID: continue zosyn for peritonitis, wbc increased but likely from operation and steroid combo, placed on Lovenox 40mg  q 12 hrs for anticoagulation, will restart coumadin today (hx of PE) PT consult today for ambulation: does not use devices at home preop

## 2015-06-22 NOTE — Progress Notes (Signed)
1 Day Post-Op   Subjective:  He was extubated this morning without difficulty. He is sitting up in bed with no particular complaints other than some mild abdominal pain. He did have some serous drainage from his dressing. There is no evidence of any significant wound problems otherwise. His respiratory  Vital signs in last 24 hours: Temp:  [98.4 F (36.9 C)-99.3 F (37.4 C)] 98.6 F (37 C) (05/16 1936) Pulse Rate:  [91-150] 110 (05/16 2000) Resp:  [9-31] 20 (05/16 2000) BP: (88-153)/(29-106) 101/53 mmHg (05/16 2000) SpO2:  [97 %-100 %] 100 % (05/16 2000) Arterial Line BP: (81-133)/(52-70) 99/52 mmHg (05/16 1400) FiO2 (%):  [45 %-50 %] 45 % (05/16 0806) Last BM Date: 06/17/15  Intake/Output from previous day: 05/15 0701 - 05/16 0700 In: 2825 [I.V.:2715; IV Piggyback:110] Out: 1300 [Urine:930; Drains:20; Blood:350]  GI: His abdomen is soft with some incisional tenderness and moderate serous drainage.  Lab Results:  CBC  Recent Labs  06/21/15 0450 06/22/15 0342  WBC 10.6 18.7*  HGB 10.3* 10.1*  HCT 31.2* 31.1*  PLT 259 328   CMP     Component Value Date/Time   NA 139 06/22/2015 0342   NA 139 05/10/2011 1008   K 4.5 06/22/2015 0342   K 4.5 05/10/2011 1008   CL 103 06/22/2015 0342   CL 98 05/10/2011 1008   CO2 27 06/22/2015 0342   CO2 32 05/10/2011 1008   GLUCOSE 207* 06/22/2015 0342   GLUCOSE 179* 05/10/2011 1008   BUN 19 06/22/2015 0342   BUN 17 05/10/2011 1008   CREATININE 0.94 06/22/2015 0342   CREATININE 0.98 05/10/2011 1008   CALCIUM 8.1* 06/22/2015 0342   CALCIUM 9.1 05/10/2011 1008   PROT 7.4 06/21/2015 0450   PROT 8.3* 05/10/2011 1008   ALBUMIN 3.8 06/21/2015 0450   ALBUMIN 3.8 05/10/2011 1008   AST 13* 06/21/2015 0450   AST 22 05/10/2011 1008   ALT 15* 06/21/2015 0450   ALT 26 05/10/2011 1008   ALKPHOS 57 06/21/2015 0450   ALKPHOS 71 05/10/2011 1008   BILITOT 1.4* 06/21/2015 0450   BILITOT 0.7 05/10/2011 1008   GFRNONAA >60 06/22/2015 0342   GFRNONAA >60 05/10/2011 1008   GFRAA >60 06/22/2015 0342   GFRAA >60 05/10/2011 1008   PT/INR  Recent Labs  06/20/15 0502 06/21/15 0518  LABPROT 26.2* 16.9*  INR 2.44 1.36    Studies/Results: No results found.  Assessment/Plan: He is doing well for first postoperative day. He does not have any significant change in his hemoglobin. I think his wound drainage is consistent with the remaining dead space. We left a drain and surgeon in order to minimize the amount of drainage. He says not having normal urine output and we will continue to follow batched function but overall he is doing quite well postsurgery. I talk with him at length.

## 2015-06-22 NOTE — Progress Notes (Signed)
eLink Physician-Brief Progress Note Patient Name: Seth Stafford DOB: [birth year] MRN: [id number]   Date of Service  06/22/2015  HPI/Events of Note  herna repair . Obese on vent. RN says restless wanting fent gtt  eICU Interventions  fent gtt Fluid bolus Vent orders     Intervention Category Evaluation Type: New Patient Evaluation  [name] 06/22/2015, 2:33 AM

## 2015-06-22 NOTE — Progress Notes (Signed)
Pt wanted to stand at side of bed. With help of Jamie, orderly, and Chastity, NT we were able to help him stand briefly at  Bedside. Pt became dizzy and sat back down. Tolerated well. HR did elevate to 140's but dropped back down to 120's when pt sat back down.  Pt now resting comfortably. Will continue to assess.

## 2015-06-22 NOTE — Anesthesia Postprocedure Evaluation (Signed)
Anesthesia Post Note  Patient: Seth Stafford  Procedure(s) Performed: Procedure(s) (LRB): REPAIR OF INCARCERATED VENTRAL HERNIA (N/A)  Patient location during evaluation: ICU Anesthesia Type: General Level of consciousness: awake, patient cooperative, confused and patient remains intubated per anesthesia plan Pain management: pain level controlled Vital Signs Assessment: post-procedure vital signs reviewed and stable Respiratory status: patient remains intubated per anesthesia plan and patient on ventilator - see flowsheet for VS Cardiovascular status: blood pressure returned to baseline Postop Assessment: no headache, no backache and no signs of nausea or vomiting Anesthetic complications: no    Last Vitals:  Filed Vitals:   06/22/15 0600 06/22/15 0700  BP: 99/53 104/42  Pulse: 124 133  Temp:    Resp: 14 15    Last Pain:  Filed Vitals:   06/22/15 0714  PainSc: 5                  Chiropodist

## 2015-06-22 NOTE — Progress Notes (Signed)
Patient ID: Seth Stafford, male   DOB: Dec 05, 1960, 55 y.o.   MRN: [id number] [facility] [place] at [facility]   PATIENT NAME: Seth Stafford    MR#:  [id number]  DATE OF BIRTH:  Oct 21, 1960  SUBJECTIVE:  Patient is status post incarcerated ventral hernia repair. Patient extubated early this morning. He is doing well.  Patient was somewhat tachycardic earlier. He's working with physical therapy at present. REVIEW OF SYSTEMS:   Review of Systems  Constitutional: Negative for fever, chills and weight loss.  HENT: Negative for ear discharge, ear pain and nosebleeds.   Eyes: Negative for blurred vision, pain and discharge.  Respiratory: Negative for sputum production, shortness of breath, wheezing and stridor.   Cardiovascular: Negative for chest pain, palpitations, orthopnea and PND.  Gastrointestinal: Positive for abdominal pain. Negative for nausea, vomiting and diarrhea.  Genitourinary: Negative for urgency and frequency.  Musculoskeletal: Negative for back pain and joint pain.  Neurological: Positive for weakness. Negative for sensory change, speech change and focal weakness.  Psychiatric/Behavioral: Negative for depression and hallucinations. The patient is not nervous/anxious.   All other systems reviewed and are negative.  Tolerating Diet: Nothing by mouth Tolerating PT: Pending  DRUG ALLERGIES:   Allergies  Allergen Reactions  . Remicade [Infliximab] Anaphylaxis    VITALS:  Blood pressure 92/51, pulse 115, temperature 99.3 F (37.4 C), temperature source Oral, resp. rate 23, height 6' (1.829 m), weight 177.084 kg (390 lb 6.4 oz), SpO2 100 %.  PHYSICAL EXAMINATION:   Physical Exam  GENERAL:  55 y.o.-year-old patient lying in the bed with no acute distress. Morbidly obese EYES: Pupils equal, round, reactive to light and accommodation. No scleral icterus. Extraocular muscles intact.  HEENT: Head atraumatic, normocephalic. Oropharynx and  nasopharynx clear.  NECK:  Supple, no jugular venous distention. No thyroid enlargement, no tenderness.  LUNGS: Normal breath sounds bilaterally, no wheezing, rales, rhonchi. No use of accessory muscles of respiration.  CARDIOVASCULAR: S1, S2 normal. No murmurs, rubs, or gallops. Tachycardia ABDOMEN: Soft, diffuse tenderness +, nondistended. Bowel sounds present. No organomegaly or mass. Surgical dressing present EXTREMITIES: No cyanosis, clubbing or edema b/l.    NEUROLOGIC: Cranial nerves II through XII are intact. No focal Motor or sensory deficits b/l.   PSYCHIATRIC:  patient is alert and oriented x 3.  SKIN: No obvious rash, lesion, or ulcer.   LABORATORY PANEL:  CBC  Recent Labs Lab 06/22/15 0342  WBC 18.7*  HGB 10.1*  HCT 31.1*  PLT 328    Chemistries   Recent Labs Lab 06/21/15 0450 06/22/15 0342  NA 138 139  K 4.5 4.5  CL 100* 103  CO2 34* 27  GLUCOSE 175* 207*  BUN 17 19  CREATININE 0.77 0.94  CALCIUM 8.7* 8.1*  MG 1.6*  --   AST 13*  --   ALT 15*  --   ALKPHOS 57  --   BILITOT 1.4*  --    Cardiac Enzymes  Recent Labs Lab 06/19/15 1342  TROPONINI <0.03   RADIOLOGY:  No results found. ASSESSMENT AND PLAN:  Seth Stafford is a 55 y.o. male with a known history of Hypertension, sleep apnea using CPAP, RA, type 2 diabetes, morbid obesity comes to the emergency room with abdominal pain for last 3 days. Patient said he started having mid abdominal pain progressively worsening imaging studies found his incarcerated ventral hernia.   1. Abdominal pain progressively worsening secondary to incarcerated inguinal hernia management per surgery Status post repair day  1   2. Hypertension  -Resume home meds  3. Chronic anticoagulation with warfarin secondary to history of PE Patient received 2 doses of IV vitamin K to revert INR given need for surgery INR down to 1.3 -We'll discuss with surgery when safe to resume his Coumadin  4. Type 2 diabetes sliding  scale insulin  5. Morbid obesity with history of obstructive sleep apnea continue CPAP use  6. History of rheumatoid arthritis. Patient is on prednisone chronic We'll give stress dose of steroid given patient undergoing surgery start IV 50 mg 3 times a day and taper it off once done with surgery -Taper her steroid doses  7. DVT prophylaxis per surgery   Case discussed with Care Management/Social Worker. Management plans discussed with the patient, family and they are in agreement.  CODE STATUS: Full  DVT Prophylaxis: SCD teds  TOTAL TIME TAKING CARE OF THIS PATIENT: 25 minutes.  >50% time spent on counselling and coordination of care    Note: This dictation was prepared with Dragon dictation along with smaller phrase technology. Any transcriptional errors that result from this process are unintentional.  Jontez Redfield M.D on 06/22/2015 at 3:04 PM  Between 7am to 6pm - Pager - [phone]  After 6pm go to [email or web address] - password [email or web address] [facility]  [place] [facility]  Office  [phone]  CC: Primary care physician; Barbette Reichmann, MD

## 2015-06-22 NOTE — Progress Notes (Signed)
AT 1430 Assessment pt dressing was found bloody with bright red drainage pooling in dressing. Dr Orvis Brill called and updated on new drainage. There was also minimal drainage in JP drain.    New orders given to change dressing to a pressure dressing.   1315 Arterial line removed per protocol. Pt tolerated well. Scant bleeding. Pressure dressing applied.  Will continue to assess.

## 2015-06-22 NOTE — Consult Note (Signed)
PULMONARY / CRITICAL CARE MEDICINE   Name: Seth Stafford MRN: [id number] DOB: [birth year]    ADMISSION DATE:  06/19/2015 CONSULTATION DATE: 5/16  REFERRING MD:  Sharolyn Douglas  CHIEF COMPLAINT:  Abdominal pain status post hernia repair, intubated, status post extubation on 5/16  HISTORY OF PRESENT ILLNESS:   Seth Stafford is a 55 years old male with a history of hypertension, diabetes, rheumatoid arthritis, sleep apnea uses CPAP at night, morbid obesity, history of PE ,diverticulosis. Patient was presented to the ED on 5/13 with complaints of abdominal pain and no BM for 3 days. Patient denied any nausea, vomiting he had loss of appetite. Patient was evaluated by gastroenterology and was diagnosed to have incarcerated ventral hernia. Patient was operated on 5/15 for the repair of incarcerated ventral hernia was intubated during the same day, extubated on 5/16, now on 4 L of oxygen and maintaining sats greater than 95% and ready to be moved to the floor. PAST MEDICAL HISTORY :  He  has a past medical history of Hypertension; Diabetes mellitus without complication (HCC); RA (rheumatoid arthritis) (HCC); Sleep apnea; CPAP (continuous positive airway pressure) dependence; On home oxygen therapy; Morbid (severe) obesity due to excess calories (HCC); PE (pulmonary embolism) (2001); Diverticulosis; Collagen vascular disease (HCC); and Difficult intubation.  PAST SURGICAL HISTORY: He  has no past surgical history on file.  Allergies  Allergen Reactions  . Remicade [Infliximab] Anaphylaxis    No current facility-administered medications on file prior to encounter.   No current outpatient prescriptions on file prior to encounter.    FAMILY HISTORY:  His has no family status information on file.   SOCIAL HISTORY: He  reports that he has never smoked. He does not have any smokeless tobacco history on file. He reports that he does not drink alcohol or use illicit drugs.  REVIEW OF SYSTEMS:      SUBJECTIVE:  Patient states that he has been feeling good, denies any pain, shortness of breath, nausea or vomiting.  VITAL SIGNS: BP 123/106 mmHg  Pulse 118  Temp(Src) 99.3 F (37.4 C) (Oral)  Resp 27  Ht 6' (1.829 m)  Wt 177.084 kg (390 lb 6.4 oz)  BMI 52.94 kg/m2  SpO2 98%  HEMODYNAMICS:    VENTILATOR SETTINGS: Vent Mode:  [-] Spontaneous FiO2 (%):  [45 %-50 %] 45 % Set Rate:  [12 bmp-16 bmp] 12 bmp Vt Set:  [500 mL-600 mL] 600 mL PEEP:  [5 cmH20-8 cmH20] 5 cmH20  INTAKE / OUTPUT: I/O last 3 completed shifts: In: 22 [I.V.:3798; Blood:745; IV Piggyback:264] Out: 1625 [Urine:1255; Drains:20; Blood:350]  PHYSICAL EXAMINATION: General:  Morbidly obese, extubated this morning, satting well Neuro: Awake, alert, oriented, follows commands HEENT: Atraumatic, normocephalic, no discharge Cardiovascular: S1-S2, regular rate and rhythm, no MRG noted Lungs:  Clear bilaterally, no wheezes crackles or rhonchi noted Abdomen: Obese, positive bowel sounds Musculoskeletal:  No inflammation or deformity noted Skin:  Grossly intact  LABS:  BMET  Recent Labs Lab 06/20/15 0502 06/21/15 0450 06/22/15 0342  NA 135 138 139  K 5.2* 4.5 4.5  CL 98* 100* 103  CO2 31 34* 27  BUN 21* 17 19  CREATININE 0.90 0.77 0.94  GLUCOSE 209* 175* 207*    Electrolytes  Recent Labs Lab 06/20/15 0502 06/21/15 0450 06/22/15 0342  CALCIUM 8.9 8.7* 8.1*  MG 1.2* 1.6*  --   PHOS 4.3  --   --     CBC  Recent Labs Lab 06/20/15 0502 06/21/15 0450 06/22/15 0342  WBC 10.0 10.6 18.7*  HGB 11.8* 10.3* 10.1*  HCT 35.9* 31.2* 31.1*  PLT 285 259 328    Coag's  Recent Labs Lab 06/19/15 1342 06/20/15 0502 06/21/15 0518  APTT 62* 55*  --   INR 2.48 2.44 1.36    Sepsis Markers No results for input(s): LATICACIDVEN, PROCALCITON, O2SATVEN in the last 168 hours.  ABG No results for input(s): PHART, PCO2ART, PO2ART in the last 168 hours.  Liver Enzymes  Recent Labs Lab  06/21/15 0450  AST 13*  ALT 15*  ALKPHOS 57  BILITOT 1.4*  ALBUMIN 3.8    Cardiac Enzymes  Recent Labs Lab 06/19/15 1342  TROPONINI <0.03    Glucose  Recent Labs Lab 06/21/15 0924 06/21/15 1216 06/21/15 1628 06/21/15 2039 06/22/15 0319 06/22/15 0726  GLUCAP 163* 152* 124* 134* 170* 213*    Imaging No results found.   STUDIES:  5/13 CT abdomen pelvis concerning forMidline lower abdominal ventral or paraumbilical hernia defect containing a segment of the sigmoid colon. There is inflammatorychanges of the sigmoid colon within the hernia sac likelyrepresenting acute diverticulitis. Strangulation of the herniatedbowel is less likely. Clinical correlation is recommended. Nodrainable fluid collection. No free air  CULTURES: None  ANTIBIOTICS: 5/14 Zosyn>>  SIGNIFICANT EVENTS: 5/15 Status post incarcerated ventral hernia repair  LINES/TUBES: 5/15 ET tube> 5/16  DISCUSSION: 55 years old male with a history of sleep apnea, morbid obesity, diabetes, hypertension, pulmonary embolism presents with abdominal pain status post incarcerated ventral hernia on 5/15. Intubated for the procedure. Extubated on 5/16. Doing well  ASSESSMENT / PLAN:  PULMONARY A: Elective intubation for the procedure Status post extubation on 5/16 History of sleep apnea History of PE  P:  Support with oxygen  Continue CPAP at night as needed Warfarin  CARDIOVASCULAR A:  History of hypertension P:  Management per primary hydralazine when necessary  Metoprolol twice daily and when necessary RENAL A:   No active issues P:   Routine chemistry  GASTROINTESTINAL A:  History of incarcerated hernia status post ventral hernia repair Diverticulosis P:   Clear liquid diet Management per primary Pain management per primary  HEMATOLOGIC A:   No active issues P:  Transfuse if Hgb <7  INFECTIOUS A:   Leukocytosis steroid induced  P:   Follow CBC Monitor fever curve  continue  Zosyn   ENDOCRINE A:   Diabetes mellitus  P:   Blood sugar checks routine Sliding scale insulin coverage  Rest per primary  NEUROLOGIC A:   No active issues  P:   RASS goal:0    FAMILY  - Updates: Family was updated by Dr. Darrol Angel this morning   Note: Patient is extubated and is hemodynamically stable, on 4 L of nasal cannula, maintaining sats well. Does not require ICU monitoring and can be transferred to the floor.  Seth Stafford. Pulmonary and Critical Care Medicine [facility] Pager: [phone]  06/22/2015, 11:10 AM   PCCM ATTENDING ATTESTATION: I have evaluated patient with Seth Stafford,Seth Stafford, reviewed database in its entirety and discussed care plan in detail. In addition, this patient was discussed on multidisciplinary rounds.   He is tolerating extubation well and appears ready for transfer out of ICU. PCCM will sign off. Please call if we can be of further assistance. Discussed with Dr Elray Buba, MD PCCM service Mobile [phone] Pager [phone]

## 2015-06-22 NOTE — Progress Notes (Signed)
Pt was extubated at 0830. Pt is able to speak clearly. Pt HR was elevated to high 140's. Pt given 5 mg Metoprolol per Dr.Simmonds HR no in 120's.  Pt is alert and oriented x 4. Good swallow and gag.  Will continue to assess.

## 2015-06-22 NOTE — Plan of Care (Signed)
Problem: Safety: Goal: Ability to remain free from injury will improve Outcome: Progressing Pt has remained free from injury on my shift and has shown appropriate safety awareness.

## 2015-06-22 NOTE — Transfer of Care (Deleted)
Immediate Anesthesia Transfer of Care Note  Patient: Seth Stafford  Procedure(s) Performed: Procedure(s): REPAIR OF INCARCERATED VENTRAL HERNIA (N/A)  Patient Location: PACU and ICU  Anesthesia Type:General  Level of Consciousness: unresponsive  Airway & Oxygen Therapy: Patient remains intubated per anesthesia plan  Post-op Assessment: Report given to RN and Post -op Vital signs reviewed and stable  Post vital signs: Reviewed and stable  Last Vitals:  Filed Vitals:   06/21/15 1319 06/21/15 2034  BP: 153/64 152/63  Pulse: 88 116  Temp: 36.7 C 36.7 C  Resp: 18 18    Last Pain:  Filed Vitals:   06/21/15 2058  PainSc: 5          Complications: No apparent anesthesia complications

## 2015-06-22 NOTE — Progress Notes (Addendum)
Pt has sat up on side of the bed two times today. Pt has tolerated activity well. Pt is tolerating clear liquids well. No complaints of pain on my shift other than with movement.Wound has had scant drainage into JP drain. Pt alert and oriented x4 at this time.   Dr. Orvis Brill called due to pt not voiding after foley removal. Orders given to "insert foley as an I&O, if urine comes out remove foley, if no output on insertion leave foley in."  Foley inserted by Chastity, NT. Scant urine return. Foley left in per Dr.Loflin instructions.  Dr.Loflin notified. Orders given to start LR back at 58mL/hr.   Pt in stable condition at this time.   Report given to oncoming RN.

## 2015-06-22 NOTE — Transfer of Care (Signed)
Immediate Anesthesia Transfer of Care Note  Patient: Seth Stafford  Procedure(s) Performed: Procedure(s): REPAIR OF INCARCERATED VENTRAL HERNIA (N/A)  Patient Location: ICU  Anesthesia Type:General  Level of Consciousness: sedated  Airway & Oxygen Therapy: Patient remains intubated per anesthesia plan  Post-op Assessment: Report given to RN and Post -op Vital signs reviewed and stable  Post vital signs: Reviewed and stable  Last Vitals:  Filed Vitals:   06/21/15 2034 06/22/15 0116  BP: 152/63 145/68  Pulse: 116 91  Temp: 36.7 C   Resp: 18 14    Last Pain:  Filed Vitals:   06/22/15 0131  PainSc: 5          Complications: No apparent anesthesia complications

## 2015-06-22 NOTE — Progress Notes (Signed)
At 1230 foley removed with no complications. Pt tolerated well. Will continue to assess.

## 2015-06-22 NOTE — Op Note (Signed)
06/19/2015 - 06/22/2015  1:11 AM  PATIENT:  Seth Stafford  55 y.o. male  PRE-OPERATIVE DIAGNOSIS:  incarcerated ventral hernia  POST-OPERATIVE DIAGNOSIS:  incarcerated ventral hernia  PROCEDURE:  Procedure(s): REPAIR OF INCARCERATED VENTRAL HERNIA (N/A)  SURGEON:  Surgeon(s) and Role:    * Tiney Rouge III, MD - Primary   ASSISTANTS: none   ANESTHESIA:   general  EBL:  Total I/O In: 685 [I.V.:575; IV Piggyback:110] Out: 880 [Urine:530; Blood:350]   DRAINS: (1) Jackson-Pratt drain(s) with closed bulb suction in the Subcutaneous space   LOCAL MEDICATIONS USED:  NONE   DISPOSITION OF SPECIMEN:  PATHOLOGY   DICTATION: .Dragon Dictation with the patient in supine position and after induction of appropriate general anesthesia a Foley catheter was placed. The patient Was prepped with ChloraPrep and draped sterile towels. A longitudinal incision was made over the palpable mass extending from the midepigastric area to just below the umbilicus. This incision was carried down through subcutaneous tissue with cautery. Hernia sac was quickly encountered. Dissection was required to expose the hernia sac. It was exposed down to the defect in the anterior abdominal wall was not appear to be very large. Sac was opened did appear to be some necrotic omentum and appendices epiploica but the bowel appeared to be edematous but viable. There is no evidence of any bowel necrosis. The sac was dissected back to the fascial edges and removed. The fascial opening had to be enlarged both cephalad and caudad in order to allow large bowel to be reduced in the abdominal cavity.  Once reduction was accomplished was clearly cannot be closed primarily particularly the patient's size and pulmonary condition. I was concerned with the inflammatory change and the necrotic omentum to place permanent synthetic mesh. I elected to place a piece of Flex-HD. He was also taped appropriately prepared and inserted into the  abdominal cavity. It was sutured in place to the fascial ring using 0 Prolene. There was not enough fascia to completely cover the mesh so the subcutaneous tissue and fat were used to close the dead space over the mesh using 0 Maxon. A 10 French Jackson-Pratt drain was inserted through a separate stab wound into the base of the dead space. It was secured with 3-0 nylon. Skin was clipped. Sterile dressings were applied. The patient is an return to the intensive care unit in critical condition. Sponge instrument and needle count were correct 2 in the operating room. PLAN OF CARE: Admit to inpatient   PATIENT DISPOSITION:  ICU - intubated and critically ill.   Tiney Rouge III, MD

## 2015-06-22 NOTE — Plan of Care (Signed)
Problem: Education: Goal: Knowledge of [facility] Education information/materials will improve Outcome: Completed/Met Date Met:  06/22/15 Pt and spouse oriented to unit. Introduced to staff and had questions answered about the unit.

## 2015-06-22 NOTE — Progress Notes (Signed)
ANTICOAGULATION CONSULT NOTE - Initial Consult  Pharmacy Consult for Coumadin Indication: h/o PE  Allergies  Allergen Reactions  . Remicade [Infliximab] Anaphylaxis    Patient Measurements: Height: 6' (182.9 cm) Weight: (!) 390 lb 6.4 oz (177.084 kg) IBW/kg (Calculated) : 77.6  Vital Signs: Temp: 99.3 F (37.4 C) (05/16 0800) Temp Source: Oral (05/16 0800) BP: 123/106 mmHg (05/16 0900) Pulse Rate: 118 (05/16 0900)  Labs:  Recent Labs  06/19/15 1342 06/20/15 0502 06/21/15 0450 06/21/15 0518 06/22/15 0342  HGB 12.5* 11.8* 10.3*  --  10.1*  HCT 38.7* 35.9* 31.2*  --  31.1*  PLT 299 285 259  --  328  APTT 62* 55*  --   --   --   LABPROT 26.5* 26.2*  --  16.9*  --   INR 2.48 2.44  --  1.36  --   CREATININE 0.92 0.90 0.77  --  0.94  TROPONINI <0.03  --   --   --   --     Estimated Creatinine Clearance: 145.7 mL/min (by C-G formula based on Cr of 0.94).   Medical History: Past Medical History  Diagnosis Date  . Hypertension   . Diabetes mellitus without complication (HCC)   . RA (rheumatoid arthritis) (HCC)   . Sleep apnea   . CPAP (continuous positive airway pressure) dependence   . On home oxygen therapy   . Morbid (severe) obesity due to excess calories (HCC)   . PE (pulmonary embolism) 2001  . Diverticulosis   . Collagen vascular disease (HCC)   . Difficult intubation     Medications:  Prescriptions prior to admission  Medication Sig Dispense Refill Last Dose  . allopurinol (ZYLOPRIM) 100 MG tablet Take 100 mg by mouth every morning. *Note dose*   06/18/2015 at Unknown time  . allopurinol (ZYLOPRIM) 300 MG tablet Take 300 mg by mouth daily. In the afternoon. *Note dose*   06/18/2015 at Unknown time  . Cholecalciferol (VITAMIN D-1000 MAX ST) 1000 units tablet Take 1,000 Units by mouth daily.   06/18/2015 at Unknown time  . clotrimazole-betamethasone (LOTRISONE) cream Apply 1 application topically 2 (two) times daily.  2 06/18/2015 at Unknown time  . Coenzyme  Q10 (COQ10) 200 MG CAPS Take 200 mg by mouth daily.   06/18/2015 at Unknown time  . Cyanocobalamin (RA VITAMIN B-12 TR) 1000 MCG TBCR Take 1,000 mg by mouth daily.   06/18/2015 at Unknown time  . ENBREL SURECLICK 50 MG/ML injection Inject 1 mL into the skin once a week.   Past Week at Unknown time  . folic acid (FOLVITE) 1 MG tablet Take 3 mg by mouth daily.   06/18/2015 at Unknown time  . glipiZIDE (GLUCOTROL) 10 MG tablet Take 10 mg by mouth 2 (two) times daily.   06/18/2015 at Unknown time  . hydroxychloroquine (PLAQUENIL) 200 MG tablet Take 200 mg by mouth 2 (two) times daily.   06/18/2015 at Unknown time  . lisinopril-hydrochlorothiazide (PRINZIDE,ZESTORETIC) 20-12.5 MG tablet Take 1 tablet by mouth daily.   06/18/2015 at Unknown time  . metFORMIN (GLUCOPHAGE) 1000 MG tablet Take 1,000 mg by mouth 2 (two) times daily with a meal.   06/18/2015 at Unknown time  . methotrexate (RHEUMATREX) 2.5 MG tablet Take 25 mg by mouth every 7 (seven) days. Take on Wednesday.   06/16/2015  . Multiple Vitamin (MULTI-VITAMINS) TABS Take 1 tablet by mouth daily.   06/18/2015 at Unknown time  . predniSONE (DELTASONE) 5 MG tablet Take 10 mg by mouth  daily.   06/18/2015 at Unknown time  . warfarin (COUMADIN) 1 MG tablet Take 1 mg by mouth daily. Take along with 6 mg tablet to equal total dose of 7 mg.  3 06/18/2015 at Unknown time  . warfarin (COUMADIN) 6 MG tablet Take 6 mg by mouth daily. Take along with 1 mg tablet to equal 7 mg total dose.  3 06/18/2015 at Unknown time   Scheduled:  . acetaminophen  650 mg Oral Q6H  . cyclobenzaprine  10 mg Oral TID  . enoxaparin (LOVENOX) injection  40 mg Subcutaneous Q12H  . fentaNYL      . hydrocortisone sod succinate (SOLU-CORTEF) inj  50 mg Intravenous Q12H  . insulin aspart  0-15 Units Subcutaneous TID WC  . insulin aspart  0-5 Units Subcutaneous QHS  . ketorolac  30 mg Intravenous Q6H  . metoprolol tartrate  25 mg Oral BID  . pantoprazole (PROTONIX) IV  40 mg Intravenous QHS   . piperacillin-tazobactam (ZOSYN)  IV  4.5 g Intravenous Q8H  . sodium chloride flush  10 mL Intravenous Q12H  . warfarin  7 mg Oral q1800  . Warfarin - Pharmacist Dosing Inpatient   Does not apply q1800   Infusions:  . lactated ringers 75 mL/hr at 06/22/15 0900    Assessment: 55 y/o M s/p ventral hernia repair on warfarin PTA for h/o PE. Patient received phytonadione 11 mg this admission. Currently on Lovenox 40 mg bid for prophylaxis.   Goal of Therapy:  INR 2-3  Plan:  Will resume home dosing of warfarin 7 mg daily and f/u AM INR.   Luisa Hart D 06/22/2015,10:53 AM

## 2015-06-22 NOTE — Evaluation (Signed)
Physical Therapy Evaluation Patient Details Name: Seth Stafford MRN: [id number] DOB: [birth year] Today's Date: 06/22/2015   History of Present Illness  Pt admitted for incarcerated ventral hernia. Pt with complaints of abdominal pain and inability to have a BM. Pt is now s/p repair on 06/22/15. Pt with history of HTN, DM, PE, and RA. Pt extubated on 06/22/15  Clinical Impression  Pt is a pleasant 55 year old male who was admitted for incarcerated ventral hernia. Pt refuses all mobility this date as he just recently performed OOB mobility with RN staff per his request. As pt reports dizziness with mobility and weakness, pt not safe for home discharge at this time as he is not at baseline level. Pt with elevated HR with all exertion to 117 bpm. Pt just extubated this date. Pt demonstrates deficits with strength/mobility/pain. Would benefit from skilled PT to address above deficits and promote optimal return to PLOF. Recommend SNF discharge at this time.      Follow Up Recommendations SNF    Equipment Recommendations   (will need BRW)    Recommendations for Other Services       Precautions / Restrictions Precautions Precautions: Fall Restrictions Weight Bearing Restrictions: No      Mobility  Bed Mobility               General bed mobility comments: Pt refused all mobility at this time as he recently got up with RN staff.   Transfers                 General transfer comment: Pt would like to defer transfers at this time as he recently got up with RN staff. Per note, required multiple assist and pt became dizzy with movement. Will defer mobility to next treatment  Ambulation/Gait                Stairs            Wheelchair Mobility    Modified Rankin (Stroke Patients Only)       Balance Overall balance assessment:  (unable to access at this time)                                           Pertinent Vitals/Pain Pain Assessment:  0-10 Pain Score: 5  Pain Location: abdomen Pain Descriptors / Indicators: Aching Pain Intervention(s): Limited activity within patient's tolerance    Home Living Family/patient expects to be discharged to:: Private residence Living Arrangements: Spouse/significant other Available Help at Discharge: Family (spouse works during the day) Type of Home: House Home Access: Stairs to enter Entrance Stairs-Rails: None Secretary/administrator of Steps: 2 Home Layout: One level Home Equipment: None      Prior Function Level of Independence: Independent         Comments: drives and was completely independent     Hand Dominance        Extremity/Trunk Assessment   Upper Extremity Assessment: Generalized weakness (grossly 4/5)           Lower Extremity Assessment: Generalized weakness (grossly 3+/5)         Communication   Communication: No difficulties  Cognition Arousal/Alertness: Awake/alert Behavior During Therapy: WFL for tasks assessed/performed Overall Cognitive Status: Within Functional Limits for tasks assessed  General Comments      Exercises Other Exercises Other Exercises: supine ther-ex performed including B LE ankle pumps, SLR, SAQ, and hip abd/add. All ther-ex performed x10 reps with min assist for correct technique.      Assessment/Plan    PT Assessment Patient needs continued PT services  PT Diagnosis Difficulty walking;Generalized weakness;Acute pain   PT Problem List Decreased strength;Decreased balance;Decreased mobility;Decreased knowledge of use of DME;Pain  PT Treatment Interventions DME instruction;Gait training;Therapeutic exercise   PT Goals (Current goals can be found in the Care Plan section) Acute Rehab PT Goals Patient Stated Goal: to get stronger PT Goal Formulation: With patient Time For Goal Achievement: 07/06/15 Potential to Achieve Goals: Good    Frequency Min 2X/week   Barriers to discharge         Co-evaluation               End of Session Equipment Utilized During Treatment: Gait belt Activity Tolerance: Patient limited by pain;Patient limited by fatigue Patient left: in bed;with bed alarm set Nurse Communication: Mobility status         Time: 1349-1401 PT Time Calculation (min) (ACUTE ONLY): 12 min   Charges:   PT Evaluation $PT Eval Moderate Complexity: 1 Procedure PT Treatments $Therapeutic Exercise: 8-22 mins   PT G Codes:        Morrisa Aldaba 18-Jul-2015, 3:11 PM  Elizabeth Palau, PT, DPT [phone]

## 2015-06-22 NOTE — Progress Notes (Signed)
1 Day Post-Op   Subjective:  He is alert but continues to be intubated. He appears slightly confused but able to converse by writing on tablet. His pain is under reasonable control. His laboratory values are improved or consistent with his postoperative course. He does have significantly elevated heart rate but is on no pressor support at this time. He's had minimal drainage overnight from his wound. His abdomen looks good.  Vital signs in last 24 hours: Temp:  [98 F (36.7 C)] 98 F (36.7 C) (05/15 2034) Pulse Rate:  [88-150] 124 (05/16 0600) Resp:  [9-22] 14 (05/16 0600) BP: (95-153)/(53-90) 99/53 mmHg (05/16 0600) SpO2:  [97 %-100 %] 100 % (05/16 0600) Arterial Line BP: (94-133)/(53-68) 97/55 mmHg (05/16 0600) FiO2 (%):  [50 %] 50 % (05/16 0427) Last BM Date: 06/17/15  Intake/Output from previous day: 05/15 0701 - 05/16 0700 In: 2825 [I.V.:2715; IV Piggyback:110] Out: 1300 [Urine:930; Drains:20; Blood:350]  GI: He has moderate abdominal tenderness although which appears to be incisional. He does have hypoactive bowel sounds. There is no significant wound problems.  Lab Results:  CBC  Recent Labs  06/21/15 0450 06/22/15 0342  WBC 10.6 18.7*  HGB 10.3* 10.1*  HCT 31.2* 31.1*  PLT 259 328   CMP     Component Value Date/Time   NA 139 06/22/2015 0342   NA 139 05/10/2011 1008   K 4.5 06/22/2015 0342   K 4.5 05/10/2011 1008   CL 103 06/22/2015 0342   CL 98 05/10/2011 1008   CO2 27 06/22/2015 0342   CO2 32 05/10/2011 1008   GLUCOSE 207* 06/22/2015 0342   GLUCOSE 179* 05/10/2011 1008   BUN 19 06/22/2015 0342   BUN 17 05/10/2011 1008   CREATININE 0.94 06/22/2015 0342   CREATININE 0.98 05/10/2011 1008   CALCIUM 8.1* 06/22/2015 0342   CALCIUM 9.1 05/10/2011 1008   PROT 7.4 06/21/2015 0450   PROT 8.3* 05/10/2011 1008   ALBUMIN 3.8 06/21/2015 0450   ALBUMIN 3.8 05/10/2011 1008   AST 13* 06/21/2015 0450   AST 22 05/10/2011 1008   ALT 15* 06/21/2015 0450   ALT 26  05/10/2011 1008   ALKPHOS 57 06/21/2015 0450   ALKPHOS 71 05/10/2011 1008   BILITOT 1.4* 06/21/2015 0450   BILITOT 0.7 05/10/2011 1008   GFRNONAA >60 06/22/2015 0342   GFRNONAA >60 05/10/2011 1008   GFRAA >60 06/22/2015 0342   GFRAA >60 05/10/2011 1008   PT/INR  Recent Labs  06/20/15 0502 06/21/15 0518  LABPROT 26.2* 16.9*  INR 2.44 1.36    Studies/Results: Dg Chest Port 1 View  06/20/2015  CLINICAL DATA:  55 year old male status post central line placement. EXAM: PORTABLE CHEST 1 VIEW COMPARISON:  Chest x-ray 06/19/2015. FINDINGS: There is a right upper extremity PICC with tip terminating in the mid superior vena cava. Marked elevation of the right hemidiaphragm. Associated passive atelectasis throughout the base of the right lung. Left lung appears clear. Possible small right pleural effusion. No evidence of pulmonary edema. Heart size appears normal. The patient is rotated to the right on today's exam, resulting in distortion of the mediastinal contours and reduced diagnostic sensitivity and specificity for mediastinal pathology. IMPRESSION: 1. Tip of right upper extremity PICC is in the mid superior vena cava. 2. Persistent marked elevation of the right hemidiaphragm with associated passive atelectasis in the base of the right lung and probable small right pleural effusion. Electronically Signed   By: Trudie Reed M.D.   On: 06/20/2015 15:05  Dg Abd 2 Views  06/20/2015  CLINICAL DATA:  Abdominal pain, no bowel movement for 3 days EXAM: ABDOMEN - 2 VIEW COMPARISON:  5/13/ 17 FINDINGS: There is normal small bowel gas pattern. Mild distension of the right colon transverse colon and proximal left colon with gas and stool probable mild ileus. Paucity of bowel gas in distal sigmoid colon and rectum. Contrast material from recent CT scan noted within urinary bladder. No evidence of free abdominal air. IMPRESSION: Normal small bowel gas pattern. Mild distension of the right colon transverse  colon and proximal left colon with gas and stool probable colonic ileus. Paucity of bowel gas in distal sigmoid colon and rectum. Electronically Signed   By: Natasha Mead M.D.   On: 06/20/2015 10:33    Assessment/Plan: Overall he appears to be improving. He is much more alert and awake and I was anticipating this morning. I discussed at length the surgery and his need to assist in his respiratory management. We appreciate the intensivist input and await his spontaneous breathing trial. His family was not present for uptake this morning but I will plan to talk with him this evening.

## 2015-06-23 LAB — BASIC METABOLIC PANEL WITH GFR
Anion gap: 8 (ref 5–15)
BUN: 37 mg/dL — ABNORMAL HIGH (ref 6–20)
CO2: 28 mmol/L (ref 22–32)
Calcium: 7.8 mg/dL — ABNORMAL LOW (ref 8.9–10.3)
Chloride: 99 mmol/L — ABNORMAL LOW (ref 101–111)
Creatinine, Ser: 3.35 mg/dL — ABNORMAL HIGH (ref 0.61–1.24)
GFR calc Af Amer: 22 mL/min — ABNORMAL LOW
GFR calc non Af Amer: 19 mL/min — ABNORMAL LOW
Glucose, Bld: 218 mg/dL — ABNORMAL HIGH (ref 65–99)
Potassium: 4.7 mmol/L (ref 3.5–5.1)
Sodium: 135 mmol/L (ref 135–145)

## 2015-06-23 LAB — SURGICAL PATHOLOGY

## 2015-06-23 LAB — CBC
HCT: 24.4 % — ABNORMAL LOW (ref 40.0–52.0)
Hemoglobin: 7.8 g/dL — ABNORMAL LOW (ref 13.0–18.0)
MCH: 30.7 pg (ref 26.0–34.0)
MCHC: 31.8 g/dL — ABNORMAL LOW (ref 32.0–36.0)
MCV: 96.3 fL (ref 80.0–100.0)
Platelets: 283 K/uL (ref 150–440)
RBC: 2.54 MIL/uL — ABNORMAL LOW (ref 4.40–5.90)
RDW: 16.1 % — ABNORMAL HIGH (ref 11.5–14.5)
WBC: 15.7 K/uL — ABNORMAL HIGH (ref 3.8–10.6)

## 2015-06-23 LAB — GLUCOSE, CAPILLARY
GLUCOSE-CAPILLARY: 141 mg/dL — AB (ref 65–99)
GLUCOSE-CAPILLARY: 167 mg/dL — AB (ref 65–99)
GLUCOSE-CAPILLARY: 190 mg/dL — AB (ref 65–99)
GLUCOSE-CAPILLARY: 195 mg/dL — AB (ref 65–99)
GLUCOSE-CAPILLARY: 268 mg/dL — AB (ref 65–99)
Glucose-Capillary: 184 mg/dL — ABNORMAL HIGH (ref 65–99)
Glucose-Capillary: 203 mg/dL — ABNORMAL HIGH (ref 65–99)

## 2015-06-23 LAB — PROTIME-INR
INR: 1.3
PROTHROMBIN TIME: 16.3 s — AB (ref 11.4–15.0)

## 2015-06-23 LAB — CREATININE, SERUM
Creatinine, Ser: 3.21 mg/dL — ABNORMAL HIGH (ref 0.61–1.24)
GFR calc Af Amer: 23 mL/min — ABNORMAL LOW
GFR calc non Af Amer: 20 mL/min — ABNORMAL LOW

## 2015-06-23 MED ORDER — ACETAMINOPHEN 325 MG PO TABS
650.0000 mg | ORAL_TABLET | Freq: Four times a day (QID) | ORAL | Status: DC | PRN
Start: 2015-06-23 — End: 2015-06-26

## 2015-06-23 MED ORDER — PIPERACILLIN-TAZOBACTAM 3.375 G IVPB
3.3750 g | Freq: Three times a day (TID) | INTRAVENOUS | Status: DC
  Administered 2015-06-23 – 2015-06-26 (×8): 3.375 g via INTRAVENOUS
  Filled 2015-06-23 (×10): qty 50

## 2015-06-23 MED ORDER — SODIUM CHLORIDE 0.9 % IV BOLUS (SEPSIS)
1000.0000 mL | Freq: Once | INTRAVENOUS | Status: AC
  Administered 2015-06-23: 1000 mL via INTRAVENOUS

## 2015-06-23 MED ORDER — HYDROCORTISONE NA SUCCINATE PF 100 MG IJ SOLR: 50.0000 mg | Freq: Every day | INTRAMUSCULAR | Status: DC

## 2015-06-23 MED ORDER — HEPARIN SODIUM (PORCINE) 5000 UNIT/ML IJ SOLN
5000.0000 [IU] | Freq: Three times a day (TID) | INTRAMUSCULAR | Status: DC
  Administered 2015-06-23 – 2015-06-27 (×11): 5000 [IU] via SUBCUTANEOUS
  Filled 2015-06-23 (×11): qty 1

## 2015-06-23 MED ORDER — HYDROCODONE-ACETAMINOPHEN 5-325 MG PO TABS
1.0000 | ORAL_TABLET | ORAL | Status: DC | PRN
  Administered 2015-06-25 – 2015-06-27 (×3): 1 via ORAL
  Filled 2015-06-23 (×3): qty 1

## 2015-06-23 MED ORDER — SODIUM CHLORIDE 0.9 % IV BOLUS (SEPSIS)
500.0000 mL | Freq: Once | INTRAVENOUS | Status: AC
  Administered 2015-06-23: 500 mL via INTRAVENOUS

## 2015-06-23 NOTE — Progress Notes (Signed)
Dr. Sherryll Burger and Dr. Orvis Brill notified regarding a lot of bloody drainage from JP drain, putting out over 50 mls per last couple of hours with a drsg change to abd incisions. MDs acknowledged and will recheck hgb tomorrow. No new orders at this time. Will continue to assess.

## 2015-06-23 NOTE — Progress Notes (Signed)
2 Days Post-Op   Subjective:  He continues to have mild abdominal pain but is otherwise improved. He is able tolerate clear liquids. His urinary output was very low overnight as the critical care team had reduce his IV fluid until late last evening. We do not have any morning labs today. Apparently I did not order them yesterday as I had thought. We will get some laboratory values this morning. He said some mild serous drainage from his wounds.  Vital signs in last 24 hours: Temp:  [97.8 F (36.6 C)-99.3 F (37.4 C)] 97.8 F (36.6 C) (05/17 0500) Pulse Rate:  [96-149] 99 (05/17 0500) Resp:  [13-31] 13 (05/17 0500) BP: (73-123)/(29-106) 92/48 mmHg (05/17 0400) SpO2:  [97 %-100 %] 100 % (05/17 0500) Arterial Line BP: (81-116)/(52-70) 99/52 mmHg (05/16 1400) FiO2 (%):  [45 %] 45 % (05/16 0806) Last BM Date: 06/17/15  Intake/Output from previous day: 05/16 0701 - 05/17 0700 In: 1033.8 [P.O.:160; I.V.:773.8; IV Piggyback:100] Out: 305 [Urine:100; Drains:205]  GI: His abdomen is soft. He does not have any significant abdominal tenderness other than incisional tenderness. He does have some persistent serosanguineous wound drainage  Lab Results:  CBC  Recent Labs  06/21/15 0450 06/22/15 0342  WBC 10.6 18.7*  HGB 10.3* 10.1*  HCT 31.2* 31.1*  PLT 259 328   CMP     Component Value Date/Time   NA 139 06/22/2015 0342   NA 139 05/10/2011 1008   K 4.5 06/22/2015 0342   K 4.5 05/10/2011 1008   CL 103 06/22/2015 0342   CL 98 05/10/2011 1008   CO2 27 06/22/2015 0342   CO2 32 05/10/2011 1008   GLUCOSE 207* 06/22/2015 0342   GLUCOSE 179* 05/10/2011 1008   BUN 19 06/22/2015 0342   BUN 17 05/10/2011 1008   CREATININE 0.94 06/22/2015 0342   CREATININE 0.98 05/10/2011 1008   CALCIUM 8.1* 06/22/2015 0342   CALCIUM 9.1 05/10/2011 1008   PROT 7.4 06/21/2015 0450   PROT 8.3* 05/10/2011 1008   ALBUMIN 3.8 06/21/2015 0450   ALBUMIN 3.8 05/10/2011 1008   AST 13* 06/21/2015 0450   AST  22 05/10/2011 1008   ALT 15* 06/21/2015 0450   ALT 26 05/10/2011 1008   ALKPHOS 57 06/21/2015 0450   ALKPHOS 71 05/10/2011 1008   BILITOT 1.4* 06/21/2015 0450   BILITOT 0.7 05/10/2011 1008   GFRNONAA >60 06/22/2015 0342   GFRNONAA >60 05/10/2011 1008   GFRAA >60 06/22/2015 0342   GFRAA >60 05/10/2011 1008   PT/INR  Recent Labs  06/21/15 0518 06/23/15 0310  LABPROT 16.9* 16.3*  INR 1.36 1.30    Studies/Results: No results found.  Assessment/Plan: He continues to improve. We will repeat his laboratory values this morning. He was started back on Coumadin last night. I will give him a saline bolus. I can anticipate his transfer to the floor today. Main stable.

## 2015-06-23 NOTE — Progress Notes (Signed)
Report given to receiving nurse on 2C. Pt transferred in stable condition. All belongings with pt.

## 2015-06-23 NOTE — Progress Notes (Signed)
Lab called with critical creatinine level. 3.35. Read back and verified. Dr. Orvis Brill paged and notified new orders given to run 1 liter bolus of normal saline.   Will continue to assess.

## 2015-06-23 NOTE — Progress Notes (Signed)
Inpatient Diabetes Program Recommendations  AACE/ADA: New Consensus Statement on Inpatient Glycemic Control (2015)  Target Ranges:  Prepandial:   less than 140 mg/dL      Peak postprandial:   less than 180 mg/dL (1-2 hours)      Critically ill patients:  140 - 180 mg/dL  Results for ASAEL, PANN (MRN [id number]) as of 06/23/2015 10:16  Ref. Range 06/22/2015 07:26 06/22/2015 11:09 06/22/2015 16:48 06/22/2015 20:11 06/22/2015 23:25 06/23/2015 03:18 06/23/2015 07:32  Glucose-Capillary Latest Ref Range: 65-99 mg/dL 053 (H) 976 (H) 734 (H) 315 (H) 231 (H) 184 (H) 203 (H)   Review of Glycemic Control  Diabetes history: DM2 Outpatient Diabetes medications: Glipizide 10 mg BID, Metformin 1000 mg BID Current orders for Inpatient glycemic control: Novolog 0-15 units TID with meals, Novolog 0-5 units QHS  Inpatient Diabetes Program Recommendations: Insulin - Basal: If steroids will be continued as ordered (Solucortef 50 mg Q12H), please consider ordering Levemir 10 units Q24H. Please keep in mind that if basal insulin is ordered as requested, basal insulin will need to be adjusted or stopped as steroids are tapered and/or discontinued.  Thanks, Orlando Penner, RN, MSN, CDE Diabetes Coordinator Inpatient Diabetes Program [phone] (Team Pager from 8am to 5pm) [phone] ([facility] office) [phone] [facility] office) [phone] [facility] office)

## 2015-06-23 NOTE — Progress Notes (Addendum)
Patient ID: Seth Stafford, male   DOB: 1960-11-05, 55 y.o.   MRN: [id number] [facility] - [place] [facility]   PATIENT NAME: Seth Stafford    MR#:  [id number]  DATE OF BIRTH:  Jan 03, 1961  SUBJECTIVE:  Doing ok, Still lots of JP drainage and some bleeding around surgical incision per nursing. Creat worsened overnight, foley placed last night REVIEW OF SYSTEMS:   Review of Systems  Constitutional: Negative for fever, chills and weight loss.  HENT: Negative for ear discharge, ear pain and nosebleeds.   Eyes: Negative for blurred vision, pain and discharge.  Respiratory: Negative for sputum production, shortness of breath, wheezing and stridor.   Cardiovascular: Negative for chest pain, palpitations, orthopnea and PND.  Gastrointestinal: Positive for abdominal pain. Negative for nausea, vomiting and diarrhea.  Genitourinary: Negative for urgency and frequency.  Musculoskeletal: Negative for back pain and joint pain.  Neurological: Positive for weakness. Negative for sensory change, speech change and focal weakness.  Psychiatric/Behavioral: Negative for depression and hallucinations. The patient is not nervous/anxious.   All other systems reviewed and are negative.  Tolerating Diet: Nothing by mouth Tolerating PT: Pending  DRUG ALLERGIES:   Allergies  Allergen Reactions  . Remicade [Infliximab] Anaphylaxis    VITALS:  Blood pressure 96/43, pulse 103, temperature 98.3 F (36.8 C), temperature source Oral, resp. rate 17, height 6' (1.829 m), weight 177.084 kg (390 lb 6.4 oz), SpO2 98 %.  PHYSICAL EXAMINATION:   Physical Exam  GENERAL:  55 y.o.-year-old patient lying in the bed with no acute distress. Morbidly obese EYES: Pupils equal, round, reactive to light and accommodation. No scleral icterus. Extraocular muscles intact.  HEENT: Head atraumatic, normocephalic. Oropharynx and nasopharynx clear.  NECK:  Supple, no jugular venous distention. No  thyroid enlargement, no tenderness.  LUNGS: Normal breath sounds bilaterally, no wheezing, rales, rhonchi. No use of accessory muscles of respiration.  CARDIOVASCULAR: S1, S2 normal. No murmurs, rubs, or gallops. Tachycardia ABDOMEN: Soft, diffuse tenderness +, nondistended. Bowel sounds present. No organomegaly or mass. Surgical dressing present with soaked dressing with serosanginous material, JP drain serosangionous/blood EXTREMITIES: No cyanosis, clubbing or edema b/l.    NEUROLOGIC: Cranial nerves II through XII are intact. No focal Motor or sensory deficits b/l.   PSYCHIATRIC:  patient is alert and oriented x 3.  SKIN: No obvious rash, lesion, or ulcer.   LABORATORY PANEL:  CBC  Recent Labs Lab 06/23/15 0710  WBC 15.7*  HGB 7.8*  HCT 24.4*  PLT 283    Chemistries   Recent Labs Lab 06/21/15 0450  06/23/15 0710 06/23/15 1550  NA 138  < > 135  --   K 4.5  < > 4.7  --   CL 100*  < > 99*  --   CO2 34*  < > 28  --   GLUCOSE 175*  < > 218*  --   BUN 17  < > 37*  --   CREATININE 0.77  < > 3.35* 3.21*  CALCIUM 8.7*  < > 7.8*  --   MG 1.6*  --   --   --   AST 13*  --   --   --   ALT 15*  --   --   --   ALKPHOS 57  --   --   --   BILITOT 1.4*  --   --   --   < > = values in this interval not displayed. Cardiac Enzymes  Recent Labs  Lab 06/19/15 1342  TROPONINI <0.03   RADIOLOGY:  No results found. ASSESSMENT AND PLAN:  Seth Stafford is a 55 y.o. male with a known history of Hypertension, sleep apnea using CPAP, RA, type 2 diabetes, morbid obesity comes to the emergency room with abdominal pain for last 3 days. Patient said he started having mid abdominal pain progressively worsening imaging studies found his incarcerated ventral hernia.  * ARF - worrisome for ATN (creat 0.77->3.35) - will recheck STAT to confirm accuracy - recheck came out 3.21 - will check renal US for any obstruction - Foley placed last night and draining well - strict I & Os - he's +5 liters -  renally dose all meds (d/w pharmacist) - change lovenox to heparin - change zosyn dosing - stop toradol - stop Metoprolol and prn Hydralazine, can keep PRN metoprolol for rate control - Avoid Hypotension - Avoid any nephrotoxic meds  - Increase IVF to 100 cc/hr  * Abdominal pain progressively worsening secondary to incarcerated inguinal hernia management per surgery Status post repair day 2 - Surgery following   * Hypotension with h/o Hypertension  - holding BP meds  * Chronic anticoagulation with warfarin secondary to history of PE Patient received 2 doses of IV vitamin K to revert INR given need for surgery INR down to 1.3 - change lovenox to heparin for bridging as renal function worsened  * Type 2 diabetes sliding scale insulin  * Morbid obesity with history of obstructive sleep apnea continue CPAP use  * History of rheumatoid arthritis. Patient is on prednisone chronic -Taper her steroid doses (once daily today)  * DVT prophylaxis per surgery: on full dose anticoagulation (warfarin + Heparin)   Case discussed with Dr Orvis Brill  Management plans discussed with the patient, family and they are in agreement.  CODE STATUS: Full  DVT Prophylaxis: SCD teds  TOTAL TIME TAKING CARE OF THIS PATIENT: 25 minutes.  >50% time spent on counselling and coordination of care    Note: This dictation was prepared with Dragon dictation along with smaller phrase technology. Any transcriptional errors that result from this process are unintentional.  [facility], Akaya Proffit M.D on 06/23/2015 at 4:27 PM  Between 7am to 6pm - Pager - [phone]  After 6pm go to [email or web address] - password [email or web address] [facility]  [facility]  Office  [phone]  CC: Primary care physician; Barbette Reichmann, MD

## 2015-06-23 NOTE — Progress Notes (Addendum)
ANTICOAGULATION CONSULT NOTE - Initial Consult  Pharmacy Consult for Coumadin Indication: h/o PE  Allergies  Allergen Reactions  . Remicade [Infliximab] Anaphylaxis    Patient Measurements: Height: 6' (182.9 cm) Weight: (!) 390 lb 6.4 oz (177.084 kg) IBW/kg (Calculated) : 77.6  Vital Signs: Temp: 98.3 F (36.8 C) (05/17 1157) Temp Source: Oral (05/17 1157) BP: 96/43 mmHg (05/17 1157) Pulse Rate: 102 (05/17 1157)  Labs:  Recent Labs  06/21/15 0450 06/21/15 0518 06/22/15 0342 06/23/15 0310 06/23/15 0710  HGB 10.3*  --  10.1*  --  7.8*  HCT 31.2*  --  31.1*  --  24.4*  PLT 259  --  328  --  283  LABPROT  --  16.9*  --  16.3*  --   INR  --  1.36  --  1.30  --   CREATININE 0.77  --  0.94  --   --     Estimated Creatinine Clearance: 145.7 mL/min (by C-G formula based on Cr of 0.94).   Medical History: Past Medical History  Diagnosis Date  . Hypertension   . Diabetes mellitus without complication (HCC)   . RA (rheumatoid arthritis) (HCC)   . Sleep apnea   . CPAP (continuous positive airway pressure) dependence   . On home oxygen therapy   . Morbid (severe) obesity due to excess calories (HCC)   . PE (pulmonary embolism) 2001  . Diverticulosis   . Collagen vascular disease (HCC)   . Difficult intubation     Medications:  Prescriptions prior to admission  Medication Sig Dispense Refill Last Dose  . allopurinol (ZYLOPRIM) 100 MG tablet Take 100 mg by mouth every morning. *Note dose*   06/18/2015 at Unknown time  . allopurinol (ZYLOPRIM) 300 MG tablet Take 300 mg by mouth daily. In the afternoon. *Note dose*   06/18/2015 at Unknown time  . Cholecalciferol (VITAMIN D-1000 MAX ST) 1000 units tablet Take 1,000 Units by mouth daily.   06/18/2015 at Unknown time  . clotrimazole-betamethasone (LOTRISONE) cream Apply 1 application topically 2 (two) times daily.  2 06/18/2015 at Unknown time  . Coenzyme Q10 (COQ10) 200 MG CAPS Take 200 mg by mouth daily.   06/18/2015 at  Unknown time  . Cyanocobalamin (RA VITAMIN B-12 TR) 1000 MCG TBCR Take 1,000 mg by mouth daily.   06/18/2015 at Unknown time  . ENBREL SURECLICK 50 MG/ML injection Inject 1 mL into the skin once a week.   Past Week at Unknown time  . folic acid (FOLVITE) 1 MG tablet Take 3 mg by mouth daily.   06/18/2015 at Unknown time  . glipiZIDE (GLUCOTROL) 10 MG tablet Take 10 mg by mouth 2 (two) times daily.   06/18/2015 at Unknown time  . hydroxychloroquine (PLAQUENIL) 200 MG tablet Take 200 mg by mouth 2 (two) times daily.   06/18/2015 at Unknown time  . lisinopril-hydrochlorothiazide (PRINZIDE,ZESTORETIC) 20-12.5 MG tablet Take 1 tablet by mouth daily.   06/18/2015 at Unknown time  . metFORMIN (GLUCOPHAGE) 1000 MG tablet Take 1,000 mg by mouth 2 (two) times daily with a meal.   06/18/2015 at Unknown time  . methotrexate (RHEUMATREX) 2.5 MG tablet Take 25 mg by mouth every 7 (seven) days. Take on Wednesday.   06/16/2015  . Multiple Vitamin (MULTI-VITAMINS) TABS Take 1 tablet by mouth daily.   06/18/2015 at Unknown time  . predniSONE (DELTASONE) 5 MG tablet Take 10 mg by mouth daily.   06/18/2015 at Unknown time  . warfarin (COUMADIN) 1 MG tablet  Take 1 mg by mouth daily. Take along with 6 mg tablet to equal total dose of 7 mg.  3 06/18/2015 at Unknown time  . warfarin (COUMADIN) 6 MG tablet Take 6 mg by mouth daily. Take along with 1 mg tablet to equal 7 mg total dose.  3 06/18/2015 at Unknown time   Scheduled:  . acetaminophen  650 mg Oral Q6H  . allopurinol  100 mg Oral BH-q7a  . cyclobenzaprine  10 mg Oral TID  . enoxaparin (LOVENOX) injection  40 mg Subcutaneous Q12H  . folic acid  3 mg Oral Daily  . hydrocortisone sod succinate (SOLU-CORTEF) inj  50 mg Intravenous Q12H  . hydroxychloroquine  200 mg Oral BID  . insulin aspart  0-15 Units Subcutaneous TID WC  . insulin aspart  0-5 Units Subcutaneous QHS  . ketorolac  30 mg Intravenous Q6H  . metoprolol tartrate  25 mg Oral BID  . multivitamin with minerals   1 tablet Oral Daily  . pantoprazole (PROTONIX) IV  40 mg Intravenous QHS  . piperacillin-tazobactam  4.5 g Intravenous Q8H  . sodium chloride  1,000 mL Intravenous Once  . sodium chloride flush  10 mL Intravenous Q12H  . warfarin  7 mg Oral q1800  . Warfarin - Pharmacist Dosing Inpatient   Does not apply q1800   Infusions:  . lactated ringers 75 mL/hr at 06/23/15 1256    Assessment: 55 y/o M s/p ventral hernia repair on warfarin PTA for h/o PE. Patient received phytonadione 11 mg this admission. Currently on Lovenox 40 mg bid for prophylaxis.   5/15 INR: 1.36 5/17 INR: 1.3  5/17 H/H 7.8/24.4  Goal of Therapy:  INR 2-3  Plan:  Warfarin dose not charted last PM. Will continue home dosing of warfarin 7 mg daily and f/u AM INR. Will monitor H/H as dropped significantly today.   Luisa Hart D 06/23/2015,1:01 PM

## 2015-06-23 NOTE — Progress Notes (Signed)
Physical Therapy Treatment Patient Details Name: Seth Stafford MRN: [id number] DOB: May 21, 1960 Today's Date: 06/23/2015    History of Present Illness Pt admitted for incarcerated ventral hernia. Pt with complaints of abdominal pain and inability to have a BM. Pt is now s/p repair on 06/22/15. Pt with history of HTN, DM, PE, and RA. Pt extubated on 06/22/15    PT Comments    Pt eager to get up out of bed. Denies pain except irritation at catheter site. Min A for bed mobility with 2 attempts before successful to edge of bed. STS transfer with good safety; requires rock technique. Pt able to ambulate 10 ft with some fatigue and mildly elevated heart rate with brief activity. O2 saturation on 2 liters remains in mid to high 90s. Pt participates in seated exercises. During exercise, pt is noted to have active bleeding, initially source unknown as it is not from drain but small pooling on the floor between Bilateral lower extremities. Nursing contacted. Pt left in nursing care. Continue PT to progress strength, endurance to improve all functional mobility.   Follow Up Recommendations  SNF     Equipment Recommendations       Recommendations for Other Services       Precautions / Restrictions Precautions Precautions: Fall Restrictions Weight Bearing Restrictions: No    Mobility  Bed Mobility Overal bed mobility: Needs Assistance Bed Mobility: Supine to Sit     Supine to sit: Min assist     General bed mobility comments: to elevate trunk  Transfers Overall transfer level: Needs assistance Equipment used: Rolling walker (2 wheeled) Transfers: Sit to/from Stand Sit to Stand: Min assist         General transfer comment: Rocking technique use; good use of hands; mildly slow to rise.  Ambulation/Gait Ambulation/Gait assistance: Min guard Ambulation Distance (Feet): 10 Feet Assistive device: Rolling walker (2 wheeled) Gait Pattern/deviations: Step-to pattern     General Gait  Details: No LOB; slow; denies weakness in BLEs, dizziness orr any adverse effects. Ambulation complicated primarily due to many line/drains etc. Pt does fatigue. HR elevates up to 115 with short walk   Stairs            Wheelchair Mobility    Modified Rankin (Stroke Patients Only)       Balance Overall balance assessment: Needs assistance Sitting-balance support: Feet supported;No upper extremity supported Sitting balance-Leahy Scale: Good     Standing balance support: Bilateral upper extremity supported Standing balance-Leahy Scale: Fair                      Cognition Arousal/Alertness: Awake/alert Behavior During Therapy: WFL for tasks assessed/performed Overall Cognitive Status: Within Functional Limits for tasks assessed                      Exercises General Exercises - Lower Extremity Long Arc Quad: Both;AROM;10 reps;Seated Hip Flexion/Marching: AROM;Both;10 reps;Seated Toe Raises: AROM;Both;10 reps;Seated Heel Raises: AROM;Both;10 reps;Seated    General Comments        Pertinent Vitals/Pain Pain Assessment: No/denies pain (except catheter site)    Home Living                      Prior Function            PT Goals (current goals can now be found in the care plan section) Progress towards PT goals: Progressing toward goals    Frequency  Min 2X/week  PT Plan Current plan remains appropriate    Co-evaluation             End of Session Equipment Utilized During Treatment: Oxygen Activity Tolerance: Patient limited by fatigue;Patient tolerated treatment well (limited by active bleeding from abdominal site) Patient left: in chair;with call bell/phone within reach;with chair alarm set;with nursing/sitter in room     Time: 1342-1410 PT Time Calculation (min) (ACUTE ONLY): 28 min  Charges:  $Gait Training: 8-22 mins $Therapeutic Exercise: 8-22 mins                    G Codes:      Kristeen Miss,  PTA 06/23/2015, 2:25 PM

## 2015-06-23 NOTE — Progress Notes (Signed)
56 yr old male with multiple commodities POD#2 from Open ventral hernia repair last night for incarcerated sigmoid colon in hernia.  Patient doing well today.  Did have minimal UOP overnight after foley placed. Otherwise patient states pain under control, would like to sit up and move around with PT today.   Filed Vitals:   06/23/15 1000 06/23/15 1102  BP:  102/65  Pulse: 106 117  Temp:    Resp: 17    I/O last 3 completed shifts: In: 3728.8 [P.O.:160; I.V.:3158.8; IV Piggyback:410] Out: 1355 [Urine:780; Drains:225; Blood:350] Total I/O In: 375.5 [I.V.:375.5] Out: 35 [Drains:35]  PE:   Gen: NAD Res: Crackles in bases, but otherwise clear Cardio: Tachycardic, regular rhythm Abd: soft, incision clean with staples in place serosanginous drainage on dressings, JP drain serosangionous Ext: soft, well perfused  CBC Latest Ref Rng 06/23/2015 06/22/2015 06/21/2015  WBC 3.8 - 10.6 K/uL 15.7(H) 18.7(H) 10.6  Hemoglobin 13.0 - 18.0 g/dL 7.8(L) 10.1(L) 10.3(L)  Hematocrit 40.0 - 52.0 % 24.4(L) 31.1(L) 31.2(L)  Platelets 150 - 440 K/uL 283 328 259   CMP Latest Ref Rng 06/22/2015 06/21/2015 06/20/2015  Glucose 65 - 99 mg/dL 672(C) 947(S) 962(E)  BUN 6 - 20 mg/dL 19 17 36(O)  Creatinine 0.61 - 1.24 mg/dL 2.94 7.65 4.65  Sodium 135 - 145 mmol/L 139 138 135  Potassium 3.5 - 5.1 mmol/L 4.5 4.5 5.2(H)  Chloride 101 - 111 mmol/L 103 100(L) 98(L)  CO2 22 - 32 mmol/L 27 34(H) 31  Calcium 8.9 - 10.3 mg/dL 8.1(L) 8.7(L) 8.9  Total Protein 6.5 - 8.1 g/dL - 7.4 -  Total Bilirubin 0.3 - 1.2 mg/dL - 0.3(T) -  Alkaline Phos 38 - 126 U/L - 57 -  AST 15 - 41 U/L - 13(L) -  ALT 17 - 63 U/L - 15(L) -    A/P:  55 yr old male with multiple commodities POD#2 from Open ventral hernia repair last night for incarcerated sigmoid colon in hernia  Pain: scheduled tylenol, toradol, and flexeril with prn oxycodone and diluadid iv as needed Res: good pulm toilet, continue CPAP at night for sleep apnea Cardio:  tachycardic, placed on metoprolol by critical care team, will have medicine team come see when on floor again GI: doing well, will give him diabetic diet, Protonix for GI px GU: foley in place, decreased UOP with increase of Cr to 3.1 per phone report from lab but not up in computer due to power outage issues Endo: DM, continue SSI, chronic steriods for arthritis on stress dose at q12  Hem/ID: continue zosyn for peritonitis, wbc decreased , placed on Lovenox 40mg  q 12 hrs for anticoagulation, will restart coumadin today (hx of PE) PT consult today for ambulation: does not use devices at home preop

## 2015-06-24 ENCOUNTER — Inpatient Hospital Stay: Payer: 59

## 2015-06-24 LAB — PREPARE FRESH FROZEN PLASMA
UNIT DIVISION: 0
Unit division: 0

## 2015-06-24 LAB — HEMOGLOBIN: Hemoglobin: 7 g/dL — ABNORMAL LOW (ref 13.0–18.0)

## 2015-06-24 LAB — COMPREHENSIVE METABOLIC PANEL
ALBUMIN: 2.4 g/dL — AB (ref 3.5–5.0)
ALT: 12 U/L — AB (ref 17–63)
AST: 12 U/L — AB (ref 15–41)
Alkaline Phosphatase: 55 U/L (ref 38–126)
Anion gap: 6 (ref 5–15)
BILIRUBIN TOTAL: 0.7 mg/dL (ref 0.3–1.2)
BUN: 39 mg/dL — AB (ref 6–20)
CHLORIDE: 101 mmol/L (ref 101–111)
CO2: 25 mmol/L (ref 22–32)
CREATININE: 2.17 mg/dL — AB (ref 0.61–1.24)
Calcium: 7.4 mg/dL — ABNORMAL LOW (ref 8.9–10.3)
GFR calc Af Amer: 37 mL/min — ABNORMAL LOW (ref >60)
GFR calc non Af Amer: 32 mL/min — ABNORMAL LOW (ref >60)
GLUCOSE: 178 mg/dL — AB (ref 65–99)
POTASSIUM: 4.2 mmol/L (ref 3.5–5.1)
Sodium: 132 mmol/L — ABNORMAL LOW (ref 135–145)
TOTAL PROTEIN: 5.7 g/dL — AB (ref 6.5–8.1)

## 2015-06-24 LAB — CBC
HEMATOCRIT: 19.7 % — AB (ref 40.0–52.0)
Hemoglobin: 6.3 g/dL — ABNORMAL LOW (ref 13.0–18.0)
MCH: 30.6 pg (ref 26.0–34.0)
MCHC: 31.8 g/dL — ABNORMAL LOW (ref 32.0–36.0)
MCV: 96.1 fL (ref 80.0–100.0)
Platelets: 265 10*3/uL (ref 150–440)
RBC: 2.05 MIL/uL — AB (ref 4.40–5.90)
RDW: 16.3 % — AB (ref 11.5–14.5)
WBC: 12.3 10*3/uL — AB (ref 3.8–10.6)

## 2015-06-24 LAB — GLUCOSE, CAPILLARY
GLUCOSE-CAPILLARY: 204 mg/dL — AB (ref 65–99)
GLUCOSE-CAPILLARY: 182 mg/dL — AB (ref 65–99)
GLUCOSE-CAPILLARY: 174 mg/dL — AB (ref 65–99)
Glucose-Capillary: 173 mg/dL — ABNORMAL HIGH (ref 65–99)
Glucose-Capillary: 174 mg/dL — ABNORMAL HIGH (ref 65–99)
Glucose-Capillary: 220 mg/dL — ABNORMAL HIGH (ref 65–99)

## 2015-06-24 LAB — PROTIME-INR
INR: 1.19
INR: 1.24
Prothrombin Time: 15.3 seconds — ABNORMAL HIGH (ref 11.4–15.0)
Prothrombin Time: 15.8 seconds — ABNORMAL HIGH (ref 11.4–15.0)

## 2015-06-24 MED ORDER — POLYETHYLENE GLYCOL 3350 17 G PO PACK
17.0000 g | PACK | Freq: Every day | ORAL | Status: DC
  Administered 2015-06-24 – 2015-06-27 (×4): 17 g via ORAL
  Filled 2015-06-24 (×4): qty 1

## 2015-06-24 MED ORDER — SODIUM CHLORIDE 0.9 % IV SOLN: Freq: Once | INTRAVENOUS | Status: DC

## 2015-06-24 NOTE — Progress Notes (Signed)
54 yr old male with multiple commodities POD#3 from Open ventral hernia repair last night for incarcerated sigmoid colon in hernia.  Patient states that he feels better today.  He has been making good urine.  His pain is undercontrol.     Filed Vitals:   06/23/15 2005 06/24/15 0517  BP: 101/46 131/64  Pulse: 105 117  Temp: 98.5 F (36.9 C) 98 F (36.7 C)  Resp: 17 17   I/O last 3 completed shifts: In: 4615.6 [P.O.:640; I.V.:2636.6; IV Piggyback:1339] Out: 1645 [Urine:775; Drains:870] Total I/O In: 0  Out: 400 [Urine:400]  PE:  Gen: NAD Res: Crackles in bases, but otherwise clear Cardio: Tachycardic, regular rhythm Abd: soft, incision clean with staples in place minimal serosanginous drainage on dressing this AM JP drain serosangionous 870 output  Ext: soft, well perfused  CBC Latest Ref Rng 06/24/2015 06/23/2015 06/22/2015  WBC 3.8 - 10.6 K/uL 12.3(H) 15.7(H) 18.7(H)  Hemoglobin 13.0 - 18.0 g/dL 6.3(L) 7.8(L) 10.1(L)  Hematocrit 40.0 - 52.0 % 19.7(L) 24.4(L) 31.1(L)  Platelets 150 - 440 K/uL 265 283 328   CMP Latest Ref Rng 06/24/2015 06/23/2015 06/23/2015  Glucose 65 - 99 mg/dL 564(P) - 329(J)  BUN 6 - 20 mg/dL 18(A) - 41(Y)  Creatinine 0.61 - 1.24 mg/dL 6.06(T) 0.16(W) 1.09(N)  Sodium 135 - 145 mmol/L 132(L) - 135  Potassium 3.5 - 5.1 mmol/L 4.2 - 4.7  Chloride 101 - 111 mmol/L 101 - 99(L)  CO2 22 - 32 mmol/L 25 - 28  Calcium 8.9 - 10.3 mg/dL 7.4(L) - 7.8(L)  Total Protein 6.5 - 8.1 g/dL 2.3(F) - -  Total Bilirubin 0.3 - 1.2 mg/dL 0.7 - -  Alkaline Phos 38 - 126 U/L 55 - -  AST 15 - 41 U/L 12(L) - -  ALT 17 - 63 U/L 12(L) - -    A/P:  55 yr old male with multiple commodities POD#3 from Open ventral hernia repair last night for incarcerated sigmoid colon in hernia  Pain: scheduled tylenol and flexeril with prn oxycodone and diluadid iv as needed Res: good pulm toilet, continue CPAP at night for sleep apnea Cardio: tachycardic improving with fluids, appreciate  hospitalist help GI: doing well,conintue diabetic diet, protonix  GU: foley in place,UOP improving today, Cr improving as well Endo: DM, continue SSI, chronic steriods for arthritis on stress dose at once a day today Hem/ID: continue zosyn for peritonitis, wbc decreased;  Anemia, likely from combination of dilutional and acute blood loss, discussed with patient potential for blood transfusion and he would like to avoid if possible, since his renal function, UOP, BP and tachycardia have all improved with fluids will hold on transfusion for now.  I discussed with the patient and Dr. Sherryll Burger that if he gets orthostasis or worsening of the above would transfuse.  Everyone in agreement at this time, continue heparinTID and Coumadin restarted PT for ambulation: does not use devices at home preop but recommending SNF at this time

## 2015-06-24 NOTE — Progress Notes (Signed)
06/24/2015  7:03 PM   Dr. Orvis Brill instructed me to hold 18:00 dose of Warfarin until after PT/INR results. Recommended to give if INR < 2.  Bradly Chris, RN

## 2015-06-24 NOTE — Progress Notes (Signed)
3 Days Post-Op   Subjective:  Patient doing well this evening. Pain is well-controlled. He has not had a bowel movement yet but has been passing copious amounts of flatus.  Vital signs in last 24 hours: Temp:  [98 F (36.7 C)-99.1 F (37.3 C)] 99.1 F (37.3 C) (05/18 2102) Pulse Rate:  [101-117] 113 (05/18 2102) Resp:  [17-19] 18 (05/18 2102) BP: (118-132)/(64-66) 118/66 mmHg (05/18 2102) SpO2:  [99 %-100 %] 100 % (05/18 2102) Last BM Date: 06/17/15  Intake/Output from previous day: 05/17 0701 - 05/18 0700 In: 3505.6 [P.O.:480; I.V.:1886.6; IV Piggyback:1139] Out: 1340 [Urine:675; Drains:665]  GI: Abdomen is appropriately tender to palpation of the midline. Staples well approximated without erythema or purulent drainage. JP in place draining a serous sanguinous fluid.  Lab Results:  CBC  Recent Labs  06/23/15 0710 06/24/15 0420 06/24/15 1804  WBC 15.7* 12.3*  --   HGB 7.8* 6.3* 7.0*  HCT 24.4* 19.7*  --   PLT 283 265  --    CMP     Component Value Date/Time   NA 132* 06/24/2015 0420   NA 139 05/10/2011 1008   K 4.2 06/24/2015 0420   K 4.5 05/10/2011 1008   CL 101 06/24/2015 0420   CL 98 05/10/2011 1008   CO2 25 06/24/2015 0420   CO2 32 05/10/2011 1008   GLUCOSE 178* 06/24/2015 0420   GLUCOSE 179* 05/10/2011 1008   BUN 39* 06/24/2015 0420   BUN 17 05/10/2011 1008   CREATININE 2.17* 06/24/2015 0420   CREATININE 0.98 05/10/2011 1008   CALCIUM 7.4* 06/24/2015 0420   CALCIUM 9.1 05/10/2011 1008   PROT 5.7* 06/24/2015 0420   PROT 8.3* 05/10/2011 1008   ALBUMIN 2.4* 06/24/2015 0420   ALBUMIN 3.8 05/10/2011 1008   AST 12* 06/24/2015 0420   AST 22 05/10/2011 1008   ALT 12* 06/24/2015 0420   ALT 26 05/10/2011 1008   ALKPHOS 55 06/24/2015 0420   ALKPHOS 71 05/10/2011 1008   BILITOT 0.7 06/24/2015 0420   BILITOT 0.7 05/10/2011 1008   GFRNONAA 32* 06/24/2015 0420   GFRNONAA >60 05/10/2011 1008   GFRAA 37* 06/24/2015 0420   GFRAA >60 05/10/2011 1008    PT/INR  Recent Labs  06/24/15 0420 06/24/15 1804  LABPROT 15.3* 15.8*  INR 1.19 1.24    Studies/Results: US Renal  06/24/2015  CLINICAL DATA:  Acute renal failure.  Inpatient. EXAM: RENAL / URINARY TRACT ULTRASOUND COMPLETE COMPARISON:  06/19/2015 CT abdomen/pelvis. FINDINGS: Right Kidney: Length: 11.5 cm. Echogenicity within normal limits. No mass or hydronephrosis visualized. Left Kidney: Length: 12.6 cm. Echogenicity within normal limits. No hydronephrosis. Tiny non shadowing 0.4 cm echogenic focus in the interpolar left kidney appears parenchymal in location, favor milk of calcium within a tiny renal cyst. Bladder: The bladder was not visualized on this scan. IMPRESSION: 1. No hydronephrosis. 2. Tiny non shadowing 0.4 cm echogenic focus in the interpolar left kidney, which appears parenchymal in location, favor milk of calcium within a tiny renal cyst. No suspicious renal masses. 3. Nondiagnostic evaluation of the bladder, which was not visualized on this scan. Electronically Signed   By: Delbert Phenix M.D.   On: 06/24/2015 15:40    Assessment/Plan: 55 year old male status post incarcerated ventral hernia repair. Continues to recover slowly secondary to multiple medical problems. Doing well however. Discussed removal of Foley this evening. He desires to wait until the morning. Plan to remove Foley at 0600. Encourage ambulation and incentive from her usage.   Ricarda Frame,  MD FACS General Surgeon  06/24/2015

## 2015-06-24 NOTE — Clinical Social Work Note (Signed)
Date of referral: 06/24/15   Reason for consult: Facility Placement     Permission sought to share information with: Oceanographer granted to share information:: Yes, Verbal Permission Granted Name::   Agency::   Relationship::   Contact Information:    Housing/Transportation Living arrangements for the past 2 months: Single Family Home Source of Information: Patient Patient Interpreter Needed: None Criminal Activity/Legal Involvement Pertinent to Current Situation/Hospitalization: No - Comment as needed Significant Relationships: Spouse Lives with: Spouse Do you feel safe going back to the place where you live? Yes Need for family participation in patient care: Yes (Comment)  Care giving concerns: Patient lives at home with his wife.   Social Worker assessment / plan: PT assessment completed and they are recommending STR. CSW spoke with patient this afternoon and he stated that he would rather go home but he stated he may need rehab and is willing to consider it and allow me to begin a bedsearch. Patient did say it would depend on his insurance coverage. Patient was getting ready to be taken down for an ultrasound so our assessment was cut short. CSW will follow up with patient regarding his progress with PT and bed offers.  Employment status: Retired Health and safety inspector: Medicare PT Recommendations: Skilled Nursing Facility Information / Referral to community resources: Skilled Nursing Facility  Patient/Family's Response to care: Patient expressed appreciation for CSW assistance.  Patient/Family's Understanding of and Emotional Response to Diagnosis, Current Treatment, and Prognosis: Patient is aware of his current limitations and is willing to do what he needs to in order to recover.  Emotional Assessment Appearance: Appears stated  age Attitude/Demeanor/Rapport:  (pleasant and cooperative) Affect (typically observed): Accepting, Adaptable, Calm, Happy Orientation: Oriented to Self, Oriented to Place, Oriented to Time, Oriented to Situation Alcohol / Substance use: Not Applicable Psych involvement (Current and /or in the community): No (Comment)  Discharge Needs  Concerns to be addressed: Care Coordination Readmission within the last 30 days: No Current discharge risk: None Barriers to Discharge: No Barriers Identified   York Spaniel, LCSW 06/24/2015, 2:20 PM

## 2015-06-24 NOTE — Progress Notes (Signed)
Called Dr. Tonita Cong regarding patient's hemoglobin level- 6.3.  Doctor will continue to monitor patient and probably transfuse later in the day.  Arturo Morton  06/24/2015 5:21 AM

## 2015-06-24 NOTE — Progress Notes (Signed)
PT Cancellation Note  Patient Details Name: Seth Stafford MRN: [id number] DOB: [birth year]   Cancelled Treatment:    Reason Eval/Treat Not Completed: Medical issues which prohibited therapy     Session held due to hemoglobin 6.3.  Will continue at a later date when appropriate.   Danielle Dess 06/24/2015, 9:58 AM

## 2015-06-24 NOTE — Progress Notes (Signed)
ANTICOAGULATION CONSULT NOTE - Initial Consult  Pharmacy Consult for Coumadin Indication: h/o PE  Allergies  Allergen Reactions  . Remicade [Infliximab] Anaphylaxis    Patient Measurements: Height: 6' (182.9 cm) Weight: (!) 390 lb 6.4 oz (177.084 kg) IBW/kg (Calculated) : 77.6  Vital Signs: Temp: 98 F (36.7 C) (05/18 0517) Temp Source: Oral (05/18 0517) BP: 131/64 mmHg (05/18 0517) Pulse Rate: 117 (05/18 0517)  Labs:  Recent Labs  06/22/15 0342 06/23/15 0310 06/23/15 0710 06/23/15 1550 06/24/15 0420  HGB 10.1*  --  7.8*  --  6.3*  HCT 31.1*  --  24.4*  --  19.7*  PLT 328  --  283  --  265  LABPROT  --  16.3*  --   --  15.3*  INR  --  1.30  --   --  1.19  CREATININE 0.94  --  3.35* 3.21* 2.17*    Estimated Creatinine Clearance: 63.1 mL/min (by C-G formula based on Cr of 2.17).   Assessment: 55 y/o M s/p ventral hernia repair on warfarin PTA for h/o PE. Patient received phytonadione 11 mg this admission. Currently on subcutaneous heparin for VTE prophylaxis  Patient takes warfarin 7mg  daily at home  5/15 INR: 1.36 5/17 INR: 1.3, warfarin 7mg  5/18 INR: 1.19  5/17 H/H 7.8/24.4 5/18 H/H 6.3/19.7  Goal of Therapy:  INR 2-3  Plan:  Hemoglobin 6.3 today. Per surgery notes, patient improved with IV fluids, holding off on transfusion unless patient become orthostatic or worsening BP/tachy. To continue prophylactic heparin and coumadin.  INR continues to trend down as expected after vitamin K administration and holding warfarin for several days.  Will continue home dose of coumadin 7mg  daily.  Will recheck INR with AM labs and continue to follow H/H.   6/18, PharmD Clinical Pharmacist  06/24/2015 11:03 AM

## 2015-06-24 NOTE — NC FL2 (Signed)
[place] MEDICAID FL2 LEVEL OF CARE SCREENING TOOL     IDENTIFICATION  Patient Name: Seth Stafford Birthdate: 11/18/1960 Sex: male Admission Date (Current Location): 06/19/2015  [place] and [place] Number:  Chiropodist and Address:  [facility], [address] [place], [place] [address]      Provider Number: [id number]  Attending Physician Name and Address:  Ricarda Frame, MD  Relative Name and Phone Number:       Current Level of Care: Hospital Recommended Level of Care: Skilled Nursing Facility Prior Approval Number:    Date Approved/Denied:   PASRR Number: [id number]  Discharge Plan: SNF    Current Diagnoses: Patient Active Problem List   Diagnosis Date Noted  . Incarcerated ventral hernia 06/19/2015    Orientation RESPIRATION BLADDER Height & Weight     Self, Time, Situation, Place  O2, Normal (4 liters) Continent Weight: (!) 390 lb 6.4 oz (177.084 kg) Height:  6' (182.9 cm)  BEHAVIORAL SYMPTOMS/MOOD NEUROLOGICAL BOWEL NUTRITION STATUS   (none)  (none) Continent Diet  AMBULATORY STATUS COMMUNICATION OF NEEDS Skin   Extensive Assist Verbally                         Personal Care Assistance Level of Assistance  Bathing, Dressing Bathing Assistance: Limited assistance   Dressing Assistance: Limited assistance     Functional Limitations Info             SPECIAL CARE FACTORS FREQUENCY  PT (By licensed PT)                    Contractures Contractures Info: Not present    Additional Factors Info  Code Status Code Status Info: Full             Current Medications (06/24/2015):  This is the current hospital active medication list Current Facility-Administered Medications  Medication Dose Route Frequency Provider Last Rate Last Dose  . acetaminophen (TYLENOL) tablet 650 mg  650 mg Oral Q6H Gladis Riffle, MD   650 mg at 06/24/15 1022  . acetaminophen (TYLENOL) tablet 650 mg  650 mg Oral  Q6H PRN Delfino Lovett, MD   650 mg at 06/23/15 2253  . allopurinol (ZYLOPRIM) tablet 100 mg  100 mg Oral Gabriela Eves, MD   100 mg at 06/24/15 0606  . cyclobenzaprine (FLEXERIL) tablet 10 mg  10 mg Oral TID Gladis Riffle, MD   10 mg at 06/24/15 1022  . diphenhydrAMINE (BENADRYL) capsule 25 mg  25 mg Oral Q6H PRN Ricarda Frame, MD       Or  . diphenhydrAMINE (BENADRYL) injection 25 mg  25 mg Intravenous Q6H PRN Ricarda Frame, MD      . folic acid (FOLVITE) tablet 3 mg  3 mg Oral Daily Enedina Finner, MD   3 mg at 06/24/15 1022  . heparin injection 5,000 Units  5,000 Units Subcutaneous Q8H Delfino Lovett, MD   5,000 Units at 06/24/15 1411  . HYDROcodone-acetaminophen (NORCO/VICODIN) 5-325 MG per tablet 1 tablet  1 tablet Oral Q4H PRN Delfino Lovett, MD      . HYDROmorphone (DILAUDID) injection 1 mg  1 mg Intravenous Q3H PRN Gladis Riffle, MD      . hydroxychloroquine (PLAQUENIL) tablet 200 mg  200 mg Oral BID Enedina Finner, MD   200 mg at 06/23/15 1105  . insulin aspart (novoLOG) injection 0-15 Units  0-15 Units Subcutaneous TID WC Oley Balm  Simonds, MD   5 Units at 06/24/15 1235  . insulin aspart (novoLOG) injection 0-5 Units  0-5 Units Subcutaneous QHS Merwyn Katos, MD   4 Units at 06/22/15 2123  . metoprolol (LOPRESSOR) injection 2.5-5 mg  2.5-5 mg Intravenous Q3H PRN Merwyn Katos, MD   5 mg at 06/22/15 0841  . multivitamin with minerals tablet 1 tablet  1 tablet Oral Daily Enedina Finner, MD   1 tablet at 06/24/15 1022  . oxyCODONE (Oxy IR/ROXICODONE) immediate release tablet 10 mg  10 mg Oral Q3H PRN Gladis Riffle, MD      . pantoprazole (PROTONIX) injection 40 mg  40 mg Intravenous QHS Ricarda Frame, MD   40 mg at 06/23/15 2253  . piperacillin-tazobactam (ZOSYN) IVPB 3.375 g  3.375 g Intravenous Q8H Delfino Lovett, MD   3.375 g at 06/24/15 0606  . polyethylene glycol (MIRALAX / GLYCOLAX) packet 17 g  17 g Oral Daily Gladis Riffle, MD   17 g at 06/24/15 1023  . sodium chloride flush  (NS) 0.9 % injection 10 mL  10 mL Intravenous Q12H Diego F Pabon, MD   10 mL at 06/24/15 1000  . warfarin (COUMADIN) tablet 7 mg  7 mg Oral q1800 Gladis Riffle, MD   7 mg at 06/23/15 1746  . Warfarin - Pharmacist Dosing Inpatient   Does not apply q1800 Gladis Riffle, MD         Discharge Medications: Please see discharge summary for a list of discharge medications.  Relevant Imaging Results:  Relevant Lab Results:   Additional Information SS: [id number]  York Spaniel, LCSW

## 2015-06-24 NOTE — Progress Notes (Signed)
SNF and Non-Emergent EMS Transport Benefits:  Number called: [phone] Rep: Keenan Bachelor Reference Number: [id number]  [facility] Plan active as of 08/06/13.  In-network deductible is $500, which has been met for policy year.  Out of network deductible is $1000, of which nothing has been met so far.  In-network out of pocket max is $1500, which has been satisfied for the policy year.  Out of network out of pocket max is $3000, of which nothing met so far.    In-network SNF: Covered at 100% of eligible expenses since both deductible and out of pocket satisfied.  Limited to 60 days per policy year, all available at this time.  Josem Kaufmann is required: [phone].  Non-Emergent EMS Transport: 40% co-insurance after $1000 out of network deductible has been met.  Josem Kaufmann is required for commercial plans: [phone].

## 2015-06-24 NOTE — Progress Notes (Signed)
Patient ID: ALVIS EDGELL, male   DOB: 1960-03-01, 55 y.o.   MRN: [id number] [facility] [place] at [facility]   PATIENT NAME: Seth Stafford    MR#:  [id number]  DATE OF BIRTH:  1960/08/13  SUBJECTIVE:  Hb dropped to 6.3, not symptomatic.   REVIEW OF SYSTEMS:   Review of Systems  Constitutional: Negative for fever, chills and weight loss.  HENT: Negative for ear discharge, ear pain and nosebleeds.   Eyes: Negative for blurred vision, pain and discharge.  Respiratory: Negative for sputum production, shortness of breath, wheezing and stridor.   Cardiovascular: Negative for chest pain, palpitations, orthopnea and PND.  Gastrointestinal: Positive for abdominal pain. Negative for nausea, vomiting and diarrhea.  Genitourinary: Negative for urgency and frequency.  Musculoskeletal: Negative for back pain and joint pain.  Neurological: Positive for weakness. Negative for sensory change, speech change and focal weakness.  Psychiatric/Behavioral: Negative for depression and hallucinations. The patient is not nervous/anxious.   All other systems reviewed and are negative.  Tolerating Diet: Nothing by mouth Tolerating PT: Pending  DRUG ALLERGIES:   Allergies  Allergen Reactions  . Remicade [Infliximab] Anaphylaxis    VITALS:  Blood pressure 131/64, pulse 117, temperature 98 F (36.7 C), temperature source Oral, resp. rate 17, height 6' (1.829 m), weight 177.084 kg (390 lb 6.4 oz), SpO2 100 %.  PHYSICAL EXAMINATION:   Physical Exam  GENERAL:  55 y.o.-year-old patient lying in the bed with no acute distress. Morbidly obese EYES: Pupils equal, round, reactive to light and accommodation. No scleral icterus. Extraocular muscles intact.  HEENT: Head atraumatic, normocephalic. Oropharynx and nasopharynx clear.  NECK:  Supple, no jugular venous distention. No thyroid enlargement, no tenderness.  LUNGS: Normal breath sounds bilaterally, no wheezing, rales, rhonchi.  No use of accessory muscles of respiration.  CARDIOVASCULAR: S1, S2 normal. No murmurs, rubs, or gallops. Tachycardia ABDOMEN: Soft, diffuse tenderness +, nondistended. Bowel sounds present. No organomegaly or mass. Surgical dressing present with soaked dressing with serosanginous material, JP drain serosangionous/blood EXTREMITIES: No cyanosis, clubbing or edema b/l.    NEUROLOGIC: Cranial nerves II through XII are intact. No focal Motor or sensory deficits b/l.   PSYCHIATRIC:  patient is alert and oriented x 3.  SKIN: No obvious rash, lesion, or ulcer.   LABORATORY PANEL:  CBC  Recent Labs Lab 06/24/15 0420  WBC 12.3*  HGB 6.3*  HCT 19.7*  PLT 265    Chemistries   Recent Labs Lab 06/21/15 0450  06/24/15 0420  NA 138  < > 132*  K 4.5  < > 4.2  CL 100*  < > 101  CO2 34*  < > 25  GLUCOSE 175*  < > 178*  BUN 17  < > 39*  CREATININE 0.77  < > 2.17*  CALCIUM 8.7*  < > 7.4*  MG 1.6*  --   --   AST 13*  --  12*  ALT 15*  --  12*  ALKPHOS 57  --  55  BILITOT 1.4*  --  0.7  < > = values in this interval not displayed. Cardiac Enzymes  Recent Labs Lab 06/19/15 1342  TROPONINI <0.03   RADIOLOGY:  No results found. ASSESSMENT AND PLAN:  Seth Stafford is a 55 y.o. male with a known history of Hypertension, sleep apnea using CPAP, RA, type 2 diabetes, morbid obesity comes to the emergency room with abdominal pain for last 3 days. Patient said he started having mid abdominal pain progressively  worsening imaging studies found his incarcerated ventral hernia.   * ARF - worrisome for ATN (creat 0.77->3.35-> 3.21->2.17 - will check renal US for any obstruction - Foley placed last night and draining well - strict I & Os - he's +7 liters - renally dose all meds (d/w pharmacist) - changed lovenox to heparin - changed zosyn dosing - stopped toradol - stopped Metoprolol and prn Hydralazine, can keep PRN metoprolol for rate control - Avoid Hypotension - Avoid any nephrotoxic  meds  - can stop IVFs today as he's significantly positive fluid balance.  * Anemia - could be hemodilution with some possible blood loss during surgery. - Hb 6.3 - d/w patient, family and Dr Orvis Brill.  - Agree with holding transfusion for now.  * Abdominal pain progressively worsening secondary to incarcerated inguinal hernia management per surgery Status post repair day 3 - Surgery following   * Hypotension with h/o Hypertension  - holding BP meds  * Chronic anticoagulation with warfarin secondary to history of PE Patient received 2 doses of IV vitamin K to revert INR given need for surgery INR down to 1.3 - change lovenox to heparin for bridging as renal function worsened, Restart coumadin  * Type 2 diabetes sliding scale insulin  * Morbid obesity with history of obstructive sleep apnea continue CPAP use  * History of rheumatoid arthritis. Patient is on prednisone chronic -Taper her steroid doses (once daily today)  * DVT prophylaxis per surgery: on full dose anticoagulation (warfarin + Heparin)   Case discussed with Dr Orvis Brill  Management plans discussed with the patient, family and they are in agreement.  CODE STATUS: Full  DVT Prophylaxis: SCD teds  TOTAL TIME TAKING CARE OF THIS PATIENT: 25 minutes.  >50% time spent on counselling and coordination of care    Note: This dictation was prepared with Dragon dictation along with smaller phrase technology. Any transcriptional errors that result from this process are unintentional.  [facility], March Joos M.D on 06/24/2015 at 1:46 PM  Between 7am to 6pm - Pager - [phone]  After 6pm go to [email or web address] - password [email or web address] [facility]  [place] [facility]  Office  [phone]  CC: Primary care physician; Barbette Reichmann, MD

## 2015-06-25 LAB — BASIC METABOLIC PANEL
Anion gap: 8 (ref 5–15)
BUN: 29 mg/dL — AB (ref 6–20)
CALCIUM: 8.4 mg/dL — AB (ref 8.9–10.3)
CO2: 31 mmol/L (ref 22–32)
CREATININE: 1.05 mg/dL (ref 0.61–1.24)
Chloride: 100 mmol/L — ABNORMAL LOW (ref 101–111)
GFR calc Af Amer: >60 mL/min (ref >60)
GFR calc non Af Amer: >60 mL/min (ref >60)
Glucose, Bld: 213 mg/dL — ABNORMAL HIGH (ref 65–99)
Potassium: 4.5 mmol/L (ref 3.5–5.1)
Sodium: 139 mmol/L (ref 135–145)

## 2015-06-25 LAB — GLUCOSE, CAPILLARY
GLUCOSE-CAPILLARY: 191 mg/dL — AB (ref 65–99)
GLUCOSE-CAPILLARY: 183 mg/dL — AB (ref 65–99)
GLUCOSE-CAPILLARY: 187 mg/dL — AB (ref 65–99)
Glucose-Capillary: 229 mg/dL — ABNORMAL HIGH (ref 65–99)

## 2015-06-25 LAB — CBC
HCT: 21.9 % — ABNORMAL LOW (ref 40.0–52.0)
Hemoglobin: 7.1 g/dL — ABNORMAL LOW (ref 13.0–18.0)
MCH: 30.4 pg (ref 26.0–34.0)
MCHC: 32.4 g/dL (ref 32.0–36.0)
MCV: 93.9 fL (ref 80.0–100.0)
PLATELETS: 337 10*3/uL (ref 150–440)
RBC: 2.33 MIL/uL — AB (ref 4.40–5.90)
RDW: 16.4 % — AB (ref 11.5–14.5)
WBC: 10.8 10*3/uL — AB (ref 3.8–10.6)

## 2015-06-25 LAB — PROTIME-INR
INR: 1.2
PROTHROMBIN TIME: 15.4 s — AB (ref 11.4–15.0)

## 2015-06-25 MED ORDER — PREDNISONE 10 MG PO TABS
10.0000 mg | ORAL_TABLET | Freq: Every day | ORAL | Status: DC
  Administered 2015-06-26: 10 mg via ORAL
  Filled 2015-06-25: qty 1

## 2015-06-25 NOTE — Care Management (Addendum)
Admitted post up hernia repair.  Lives at home with wife.  Obtains medications from [facility], and [facility] [place].  Patient states that he has a cane in the home.  PT was recommending SNF. Now has been determined that patient is appropriate for home with home health, see PT addendum to note. Home health order has been placed.  Patient was provided with agency choice.  Referral was made to [facility], Barbara Cower with [facility] notified.  Order for RW placed.  To be delivered to room.  Anticipate discharge 06/26/15. Chronic o2 through [employer].  Family to bring portable tank for discharge.

## 2015-06-25 NOTE — Progress Notes (Signed)
Patient ID: AMIERE CAWLEY, male   DOB: Sep 14, 1960, 55 y.o.   MRN: [id number] [facility] [place] at [facility]   PATIENT NAME: Cezar Misiaszek    MR#:  [id number]  DATE OF BIRTH:  March 23, 1960  SUBJECTIVE:  Hb 7.1, not symptomatic. Renal function normalised. Foley out this am. No BM yet    Review of Systems  Constitutional: Negative for fever, chills and weight loss.  HENT: Negative for ear discharge, ear pain and nosebleeds.   Eyes: Negative for blurred vision, pain and discharge.  Respiratory: Negative for sputum production, shortness of breath, wheezing and stridor.   Cardiovascular: Negative for chest pain, palpitations, orthopnea and PND.  Gastrointestinal: Positive for abdominal pain. Negative for nausea, vomiting and diarrhea.  Genitourinary: Negative for urgency and frequency.  Musculoskeletal: Negative for back pain and joint pain.  Neurological: Positive for weakness. Negative for sensory change, speech change and focal weakness.  Psychiatric/Behavioral: Negative for depression and hallucinations. The patient is not nervous/anxious.   All other systems reviewed and are negative.  Tolerating Diet: yes Tolerating PT: STR  DRUG ALLERGIES:   Allergies  Allergen Reactions  . Remicade [Infliximab] Anaphylaxis   VITALS:  Blood pressure 135/64, pulse 108, temperature 97.5 F (36.4 C), temperature source Oral, resp. rate 20, height 6' (1.829 m), weight 177.084 kg (390 lb 6.4 oz), SpO2 100 %.  PHYSICAL EXAMINATION:   Physical Exam  GENERAL:  55 y.o.-year-old patient lying in the bed with no acute distress. Morbidly obese EYES: Pupils equal, round, reactive to light and accommodation. No scleral icterus. Extraocular muscles intact.  HEENT: Head atraumatic, normocephalic. Oropharynx and nasopharynx clear.  NECK:  Supple, no jugular venous distention. No thyroid enlargement, no tenderness.  LUNGS: Normal breath sounds bilaterally, no wheezing, rales,  rhonchi. No use of accessory muscles of respiration.  CARDIOVASCULAR: S1, S2 normal. No murmurs, rubs, or gallops. Tachycardia ABDOMEN: Soft, diffuse tenderness +, nondistended. Bowel sounds present. No organomegaly or mass. Surgical dressing present with soaked dressing with serosanginous material, JP drain serosangionous/blood EXTREMITIES: No cyanosis, clubbing or edema b/l.    NEUROLOGIC: Cranial nerves II through XII are intact. No focal Motor or sensory deficits b/l.   PSYCHIATRIC:  patient is alert and oriented x 3.  SKIN: No obvious rash, lesion, or ulcer.   LABORATORY PANEL:  CBC  Recent Labs Lab 06/25/15 0830  WBC 10.8*  HGB 7.1*  HCT 21.9*  PLT 337    Chemistries   Recent Labs Lab 06/21/15 0450  06/24/15 0420 06/25/15 0830  NA 138  < > 132* 139  K 4.5  < > 4.2 4.5  CL 100*  < > 101 100*  CO2 34*  < > 25 31  GLUCOSE 175*  < > 178* 213*  BUN 17  < > 39* 29*  CREATININE 0.77  < > 2.17* 1.05  CALCIUM 8.7*  < > 7.4* 8.4*  MG 1.6*  --   --   --   AST 13*  --  12*  --   ALT 15*  --  12*  --   ALKPHOS 57  --  55  --   BILITOT 1.4*  --  0.7  --   < > = values in this interval not displayed. Cardiac Enzymes  Recent Labs Lab 06/19/15 1342  TROPONINI <0.03   RADIOLOGY:  US Renal  06/24/2015  CLINICAL DATA:  Acute renal failure.  Inpatient. EXAM: RENAL / URINARY TRACT ULTRASOUND COMPLETE COMPARISON:  06/19/2015 CT abdomen/pelvis.  FINDINGS: Right Kidney: Length: 11.5 cm. Echogenicity within normal limits. No mass or hydronephrosis visualized. Left Kidney: Length: 12.6 cm. Echogenicity within normal limits. No hydronephrosis. Tiny non shadowing 0.4 cm echogenic focus in the interpolar left kidney appears parenchymal in location, favor milk of calcium within a tiny renal cyst. Bladder: The bladder was not visualized on this scan. IMPRESSION: 1. No hydronephrosis. 2. Tiny non shadowing 0.4 cm echogenic focus in the interpolar left kidney, which appears parenchymal in  location, favor milk of calcium within a tiny renal cyst. No suspicious renal masses. 3. Nondiagnostic evaluation of the bladder, which was not visualized on this scan. Electronically Signed   By: Delbert Phenix M.D.   On: 06/24/2015 15:40   ASSESSMENT AND PLAN:  Naethan Bracewell is a 55 y.o. male with a known history of Hypertension, sleep apnea using CPAP, RA, type 2 diabetes, morbid obesity comes to the emergency room with abdominal pain for last 3 days. Patient said he started having mid abdominal pain progressively worsening imaging studies found his incarcerated ventral hernia.   * ARF - resolved now. - worrisome for ATN (creat 0.77->3.35-> 3.21->2.17->1.05 - will check renal US for any obstruction - Foley removed this am - Avoid Hypotension - Avoid any nephrotoxic meds   * Anemia - could be hemodilution with some possible blood loss during surgery. - Hb 7.1   * Abdominal pain progressively worsening secondary to incarcerated inguinal hernia management per surgery Status post repair day 3 - Surgery following   * Hypotension with h/o Hypertension  - holding BP meds  * Chronic anticoagulation with warfarin secondary to history of PE Patient received 2 doses of IV vitamin K to revert INR given need for surgery INR down to 1.3 - change lovenox to heparin for bridging as renal function worsened, Restart coumadin  * Type 2 diabetes sliding scale insulin  * Morbid obesity with history of obstructive sleep apnea continue CPAP use  * acute on chronic hypoxia: likely due to sleep apnea.  * History of rheumatoid arthritis. Patient is on prednisone chronic -Taper her steroid doses (once daily today)  * DVT prophylaxis per surgery: on full dose anticoagulation (warfarin + Heparin)   Case discussed with Dr Orvis Brill  Management plans discussed with the patient, family and they are in agreement.  CODE STATUS: Full  DVT Prophylaxis: SCD teds  TOTAL TIME TAKING CARE OF THIS PATIENT: 25  minutes.  >50% time spent on counselling and coordination of care    Note: This dictation was prepared with Dragon dictation along with smaller phrase technology. Any transcriptional errors that result from this process are unintentional.  [facility], Yarixa Lightcap M.D on 06/25/2015 at 5:05 PM  Between 7am to 6pm - Pager - [phone]  After 6pm go to [email or web address] - password [email or web address] [facility]  [place] [facility]  Office  [phone]  CC: Primary care physician; Barbette Reichmann, MD

## 2015-06-25 NOTE — Progress Notes (Signed)
ANTICOAGULATION CONSULT NOTE - Initial Consult  Pharmacy Consult for Coumadin Indication: h/o PE  Allergies  Allergen Reactions  . Remicade [Infliximab] Anaphylaxis    Patient Measurements: Height: 6' (182.9 cm) Weight: (!) 390 lb 6.4 oz (177.084 kg) IBW/kg (Calculated) : 77.6  Vital Signs: Temp: 97.5 F (36.4 C) (05/19 1254) Temp Source: Oral (05/19 1254) BP: 135/64 mmHg (05/19 1254) Pulse Rate: 108 (05/19 1254)  Labs:  Recent Labs  06/23/15 0710 06/23/15 1550 06/24/15 0420 06/24/15 1804 06/25/15 0830 06/25/15 1040  HGB 7.8*  --  6.3* 7.0* 7.1*  --   HCT 24.4*  --  19.7*  --  21.9*  --   PLT 283  --  265  --  337  --   LABPROT  --   --  15.3* 15.8*  --  15.4*  INR  --   --  1.19 1.24  --  1.20  CREATININE 3.35* 3.21* 2.17*  --  1.05  --     Estimated Creatinine Clearance: 130.4 mL/min (by C-G formula based on Cr of 1.05).   Assessment: 55 y/o M s/p ventral hernia repair on warfarin PTA for h/o PE. Patient received phytonadione 11 mg this admission. Currently on subcutaneous heparin for VTE prophylaxis  Patient takes warfarin 7mg  daily at home  5/15 INR: 1.36 5/17 INR: 1.3, warfarin 7mg  5/18 INR: 1.19, warfarin 7mg  5/19 INR 1.20  5/17 H/H 7.8/24.4 5/18 H/H 6.3/19.7  Goal of Therapy:  INR 2-3  Plan:  Hemoglobin 7.1 today. Per surgery notes, patient improved with IV fluids, holding off on transfusion unless patient become orthostatic or worsening BP/tachy. To continue prophylactic heparin and coumadin.  INR coming up slowly as expected after vitamin K administration and holding warfarin for several days.  Will continue home dose of coumadin 7mg  daily.  Will recheck INR with AM labs and continue to follow H/H.   02-20-1983, PharmD Clinical Pharmacist  06/25/2015 1:55 PM

## 2015-06-25 NOTE — Clinical Social Work Note (Signed)
MD informed CSW that recommendation from PT will now be home health and that patient and wife are agreeable to that. RN CM is aware. York Spaniel MSW,LCSW [phone]

## 2015-06-25 NOTE — Progress Notes (Addendum)
55 yr old male with multiple commodities POD#4 from Open ventral hernia repair last night for incarcerated sigmoid colon in hernia.  Patient states that he feels ok today, he stated that he didn't sleep well last night so is tired today.  He states that he is passing flatus and eating diabetic diet without any issues.  He has not had BM yet.  He worked with PT today and did well, walked in hallway   Filed Vitals:   06/25/15 0440 06/25/15 1254  BP: 122/72 135/64  Pulse: 113 108  Temp: 98 F (36.7 C) 97.5 F (36.4 C)  Resp: 20 20   I/O last 3 completed shifts: In: 3328 [P.O.:1380; I.V.:1759; IV Piggyback:189] Out: 4930 [Urine:3900; Drains:1030] Total I/O In: 240 [P.O.:240] Out: 420 [Urine:300; Drains:120]  PE:  Gen: NAD Res: Crackles in bases, but otherwise clear Cardio: Tachycardic, regular rhythm Abd: soft, incision clean with staples in place minimal serosanginous drainage on dressing this AM JP drain serosangionous 120 output  Ext: soft, well perfused  CBC Latest Ref Rng 06/25/2015 06/24/2015 06/24/2015  WBC 3.8 - 10.6 K/uL 10.8(H) - 12.3(H)  Hemoglobin 13.0 - 18.0 g/dL 7.1(L) 7.0(L) 6.3(L)  Hematocrit 40.0 - 52.0 % 21.9(L) - 19.7(L)  Platelets 150 - 440 K/uL 337 - 265   CMP Latest Ref Rng 06/25/2015 06/24/2015 06/23/2015  Glucose 65 - 99 mg/dL 594(V) 859(Y) -  BUN 6 - 20 mg/dL 92(K) 46(K) -  Creatinine 0.61 - 1.24 mg/dL 8.63 8.17(R) 1.16(F)  Sodium 135 - 145 mmol/L 139 132(L) -  Potassium 3.5 - 5.1 mmol/L 4.5 4.2 -  Chloride 101 - 111 mmol/L 100(L) 101 -  CO2 22 - 32 mmol/L 31 25 -  Calcium 8.9 - 10.3 mg/dL 7.9(U) 7.4(L) -  Total Protein 6.5 - 8.1 g/dL - 5.7(L) -  Total Bilirubin 0.3 - 1.2 mg/dL - 0.7 -  Alkaline Phos 38 - 126 U/L - 55 -  AST 15 - 41 U/L - 12(L) -  ALT 17 - 63 U/L - 12(L) -    A/P:  55 yr old male with multiple commodities POD#4 from Open ventral hernia repair last night for incarcerated sigmoid colon in hernia  Pain: scheduled tylenol and flexeril with  prn oxycodone and diluadid iv as needed Res: good pulm toilet, continue CPAP at night for sleep apnea Cardio: tachycardic improving with fluids, appreciate hospitalist help GI: doing well,conintue diabetic diet, protonix  GU: foley discontinued, voiding well Endo: DM, continue SSI; restarted home prednisone Hem/ID: Anemia, from acute blood loss and dilutional; Hgb 7 today, no orthostasis, transition to Augmentin at discharge PT evaluation, recommending home health PT and rolling walker; likely d/c home in next day or two

## 2015-06-25 NOTE — Progress Notes (Addendum)
Physical Therapy Treatment Patient Details Name: Seth Stafford MRN: [id number] DOB: [birth year] Today's Date: 06/25/2015    History of Present Illness Pt admitted for incarcerated ventral hernia. Pt with complaints of abdominal pain and inability to have a BM. Pt is now s/p repair on 06/22/15. Pt with history of HTN, DM, PE, and RA. Pt extubated on 06/22/15    PT Comments    Pt in bed ready for session.  Able to sit without assist at edge of bed.  Stood and was able to ambulate in room on 4lpm with walker.  O2 at 99% after gait with HR 110.  Pt took seated rest then ambulated 100' in room with walker with min guard.  After second ambulation O2 sats 98% HR 133.  Pt seated and HR began to return to normal.  Pt generally fatigued with ambulation but overall tolerated well.  Pt left sitting up in chair stating it made his back feel better.    Follow Up Recommendations  SNF     Equipment Recommendations       Recommendations for Other Services       Precautions / Restrictions Precautions Precautions: Fall Restrictions Weight Bearing Restrictions: No    Mobility  Bed Mobility Overal bed mobility: Modified Independent Bed Mobility: Supine to Sit     Supine to sit: Modified independent (Device/Increase time);HOB elevated (rail)        Transfers Overall transfer level: Needs assistance Equipment used: Rolling walker (2 wheeled) Transfers: Sit to/from Stand Sit to Stand: Supervision         General transfer comment: no dizziness noted today  Ambulation/Gait Ambulation/Gait assistance: Min guard Ambulation Distance (Feet): 50 Feet Assistive device: Rolling walker (2 wheeled) Gait Pattern/deviations: Step-through pattern   Gait velocity interpretation: <1.8 ft/sec, indicative of risk for recurrent falls General Gait Details: generally steady, limited by pain and fatigue   Stairs            Wheelchair Mobility    Modified Rankin (Stroke Patients Only)       Balance Overall balance assessment: Needs assistance         Standing balance support: Bilateral upper extremity supported Standing balance-Leahy Scale: Fair                      Cognition Arousal/Alertness: Awake/alert Behavior During Therapy: WFL for tasks assessed/performed Overall Cognitive Status: Within Functional Limits for tasks assessed                      Exercises      General Comments        Pertinent Vitals/Pain Pain Assessment: 0-10 Pain Score: 5  Pain Location: "belly button" Pain Descriptors / Indicators: Sore Pain Intervention(s): Limited activity within patient's tolerance    Home Living                      Prior Function            PT Goals (current goals can now be found in the care plan section) Progress towards PT goals: Progressing toward goals    Frequency  Min 2X/week    PT Plan Current plan remains appropriate    Co-evaluation             End of Session Equipment Utilized During Treatment: Oxygen Activity Tolerance: Patient limited by fatigue;Patient tolerated treatment well Patient left: in chair;with call bell/phone within reach;with chair alarm set  Time: 9242-6834 PT Time Calculation (min) (ACUTE ONLY): 23 min  Charges:  $Gait Training: 23-37 mins                    G Codes:      Danielle Dess, PTA 06/25/2015, 1:21 PM  Addendum:  Discussed with MD regarding D/C plan and need for STR vs Home.  After discussion with MD pt would be OK to D/C home with home health care services.

## 2015-06-26 LAB — GLUCOSE, CAPILLARY
GLUCOSE-CAPILLARY: 219 mg/dL — AB (ref 65–99)
GLUCOSE-CAPILLARY: 234 mg/dL — AB (ref 65–99)
Glucose-Capillary: 229 mg/dL — ABNORMAL HIGH (ref 65–99)
Glucose-Capillary: 216 mg/dL — ABNORMAL HIGH (ref 65–99)

## 2015-06-26 LAB — HEMOGLOBIN: Hemoglobin: 7.1 g/dL — ABNORMAL LOW (ref 13.0–18.0)

## 2015-06-26 LAB — PROTIME-INR
INR: 1.44
PROTHROMBIN TIME: 17.6 s — AB (ref 11.4–15.0)

## 2015-06-26 MED ORDER — LACTULOSE 10 GM/15ML PO SOLN
30.0000 g | Freq: Every day | ORAL | Status: DC
  Administered 2015-06-26 – 2015-06-27 (×2): 30 g via ORAL
  Filled 2015-06-26 (×2): qty 60

## 2015-06-26 MED ORDER — AMOXICILLIN-POT CLAVULANATE 875-125 MG PO TABS
1.0000 | ORAL_TABLET | Freq: Two times a day (BID) | ORAL | Status: DC
  Administered 2015-06-26 – 2015-06-27 (×3): 1 via ORAL
  Filled 2015-06-26 (×3): qty 1

## 2015-06-26 MED ORDER — PANTOPRAZOLE SODIUM 40 MG PO TBEC
40.0000 mg | DELAYED_RELEASE_TABLET | Freq: Every day | ORAL | Status: DC
  Administered 2015-06-26: 40 mg via ORAL
  Filled 2015-06-26: qty 1

## 2015-06-26 NOTE — Progress Notes (Signed)
55 yr old male with multiple commodities POD#5 from Open ventral hernia repair last night for incarcerated sigmoid colon in hernia.  Patient states doing well today.  He has been up and moving around.  He states that pain is well controlled.  He thinks appetite is lightly better.  He has been passing flatus but no BM yet, states he feels as if he is close.  Filed Vitals:   06/26/15 0546 06/26/15 1306  BP: 141/52 123/68  Pulse: 110 107  Temp: 97.5 F (36.4 C) 98.3 F (36.8 C)  Resp: 20 20   I/O last 3 completed shifts: In: 240 [P.O.:240] Out: 2905 [Urine:2350; Drains:555] Total I/O In: 240 [P.O.:240] Out: 880 [Urine:700; Drains:180]  PE:  Gen: NAD Res: Crackles in bases, but otherwise clear Cardio: Tachycardic, regular rhythm Abd: soft, incision clean/dry/intact with staples no drainage, no erythema; JP drain serosangionous 180 output  Ext: soft, well perfused  CBC Latest Ref Rng 06/26/2015 06/25/2015 06/24/2015  WBC 3.8 - 10.6 K/uL - 10.8(H) -  Hemoglobin 13.0 - 18.0 g/dL 7.1(L) 7.1(L) 7.0(L)  Hematocrit 40.0 - 52.0 % - 21.9(L) -  Platelets 150 - 440 K/uL - 337 -   CMP Latest Ref Rng 06/25/2015 06/24/2015 06/23/2015  Glucose 65 - 99 mg/dL 229(N) 989(Q) -  BUN 6 - 20 mg/dL 11(H) 41(D) -  Creatinine 0.61 - 1.24 mg/dL 4.08 1.44(Y) 1.85(U)  Sodium 135 - 145 mmol/L 139 132(L) -  Potassium 3.5 - 5.1 mmol/L 4.5 4.2 -  Chloride 101 - 111 mmol/L 100(L) 101 -  CO2 22 - 32 mmol/L 31 25 -  Calcium 8.9 - 10.3 mg/dL 3.1(S) 7.4(L) -  Total Protein 6.5 - 8.1 g/dL - 5.7(L) -  Total Bilirubin 0.3 - 1.2 mg/dL - 0.7 -  Alkaline Phos 38 - 126 U/L - 55 -  AST 15 - 41 U/L - 12(L) -  ALT 17 - 63 U/L - 12(L) -    A/P:  55 yr old male with multiple commodities POD#5 from Open ventral hernia repair last night for incarcerated sigmoid colon in hernia  Pain: scheduled tylenol and flexeril with prn oxycodone and diluadid iv as needed Res: good pulm toilet, continue CPAP at night for sleep  apnea Cardio: tachycardic improving with fluids, appreciate hospitalist help GI: doing well,conintue diabetic diet, protonix po; miralax and lactulose, awaiting bowel function GU: foley discontinued, voiding well Endo: DM, continue SSI; restarted home prednisone Hem/ID: Anemia, from acute blood loss and dilutional; Hgb 7 today, no orthostasis, transition to Augmentin today PT evaluation, recommending home health PT and rolling walker; likely d/c home in next day or two

## 2015-06-26 NOTE — Progress Notes (Signed)
Key Points: Use following P&T approved IV to PO antibiotic change policy.  Description contains the criteria that are approved Note: Policy Excludes:  Esophagectomy patientsPHARMACIST - PHYSICIAN COMMUNICATION DR:   Tonita Cong CONCERNING: IV to Oral Route Change Policy  RECOMMENDATION: This patient is receiving pantopraozle by the intravenous route.  Based on criteria approved by the Pharmacy and Therapeutics Committee, the intravenous medication(s) is/are being converted to the equivalent oral dose form(s).   DESCRIPTION: These criteria include:  The patient is eating (either orally or via tube) and/or has been taking other orally administered medications for a least 24 hours  The patient has no evidence of active gastrointestinal bleeding or impaired GI absorption (gastrectomy, short bowel, patient on TNA or NPO).  If you have questions about this conversion, please contact the Pharmacy Department  []   [phone] )  ( [phone] [x]   [phone] )  [facility] []   [phone] )  [facility] []   [phone] )  [facility] []   [phone] )  [facility]   ( [phone], [facility] 06/26/2015 7:54 AM

## 2015-06-26 NOTE — Progress Notes (Signed)
ANTICOAGULATION CONSULT NOTE - Initial Consult  Pharmacy Consult for Coumadin Indication: h/o PE  Allergies  Allergen Reactions  . Remicade [Infliximab] Anaphylaxis    Patient Measurements: Height: 6' (182.9 cm) Weight: (!) 390 lb 6.4 oz (177.084 kg) IBW/kg (Calculated) : 77.6  Vital Signs: Temp: 97.5 F (36.4 C) (05/20 0546) Temp Source: Oral (05/20 0546) BP: 141/52 mmHg (05/20 0546) Pulse Rate: 110 (05/20 0546)  Labs:  Recent Labs  06/23/15 1550  06/24/15 0420 06/24/15 1804 06/25/15 0830 06/25/15 1040 06/26/15 0500  HGB  --   < > 6.3* 7.0* 7.1*  --  7.1*  HCT  --   --  19.7*  --  21.9*  --   --   PLT  --   --  265  --  337  --   --   LABPROT  --   < > 15.3* 15.8*  --  15.4* 17.6*  INR  --   < > 1.19 1.24  --  1.20 1.44  CREATININE 3.21*  --  2.17*  --  1.05  --   --   < > = values in this interval not displayed.  Estimated Creatinine Clearance: 130.4 mL/min (by C-G formula based on Cr of 1.05).   Assessment: 55 y/o M s/p ventral hernia repair on warfarin PTA for h/o PE. Patient received phytonadione 11 mg this admission. Currently on subcutaneous heparin for VTE prophylaxis  Patient takes warfarin 7mg  daily at home  5/15 INR: 1.36 5/17 INR: 1.3, warfarin 7mg  5/18 INR: 1.19, warfarin 7mg  5/19 INR 1.20  5/17 H/H 7.8/24.4 5/18 H/H 6.3/19.7  Goal of Therapy:  INR 2-3  Plan:  Hemoglobin 7.1 today. Per surgery notes, patient improved with IV fluids, holding off on transfusion unless patient become orthostatic or worsening BP/tachy. To continue prophylactic heparin and coumadin.  INR coming up slowly as expected after vitamin K administration and holding warfarin for several days.  Will continue home dose of coumadin 7mg  daily.  Will recheck INR with AM labs and continue to follow H/H.   02-20-1985, PharmD Clinical Pharmacist   06/26/2015 7:48 AM

## 2015-06-26 NOTE — Progress Notes (Signed)
Patient ID: Seth Stafford, male   DOB: 11-Jul-1960, 55 y.o.   MRN: [id number] [facility] [place] at [facility]   PATIENT NAME: Seth Stafford    MR#:  [id number]  DATE OF BIRTH:  08/18/1960  SUBJECTIVE:  Feeling very weak,.  Passing gas but no bowel movement yet    Review of Systems  Constitutional: Positive for malaise/fatigue. Negative for fever, chills and weight loss.  HENT: Negative for ear discharge, ear pain and nosebleeds.   Eyes: Negative for blurred vision, pain and discharge.  Respiratory: Negative for sputum production, shortness of breath, wheezing and stridor.   Cardiovascular: Negative for chest pain, palpitations, orthopnea and PND.  Gastrointestinal: Positive for constipation. Negative for nausea, vomiting, abdominal pain and diarrhea.  Genitourinary: Negative for urgency and frequency.  Musculoskeletal: Negative for back pain and joint pain.  Neurological: Positive for weakness. Negative for sensory change, speech change and focal weakness.  Psychiatric/Behavioral: Negative for depression and hallucinations. The patient is not nervous/anxious.   All other systems reviewed and are negative.  Tolerating Diet: yes Tolerating PT: STR  DRUG ALLERGIES:   Allergies  Allergen Reactions  . Remicade [Infliximab] Anaphylaxis   VITALS:  Blood pressure 141/52, pulse 110, temperature 97.5 F (36.4 C), temperature source Oral, resp. rate 20, height 6' (1.829 m), weight 177.084 kg (390 lb 6.4 oz), SpO2 99 %.  PHYSICAL EXAMINATION:   Physical Exam  GENERAL:  55 y.o.-year-old patient lying in the bed with no acute distress. Morbidly obese EYES: Pupils equal, round, reactive to light and accommodation. No scleral icterus. Extraocular muscles intact.  HEENT: Head atraumatic, normocephalic. Oropharynx and nasopharynx clear.  NECK:  Supple, no jugular venous distention. No thyroid enlargement, no tenderness.  LUNGS: Normal breath sounds bilaterally,  no wheezing, rales, rhonchi. No use of accessory muscles of respiration.  CARDIOVASCULAR: S1, S2 normal. No murmurs, rubs, or gallops. Tachycardia ABDOMEN: Soft, diffuse tenderness +, nondistended. Bowel sounds present. No organomegaly or mass. Surgical dressing present with soaked dressing with serosanginous material, JP drain serosangionous/blood EXTREMITIES: No cyanosis, clubbing or edema b/l.    NEUROLOGIC: Cranial nerves II through XII are intact. No focal Motor or sensory deficits b/l.   PSYCHIATRIC:  patient is alert and oriented x 3.  SKIN: No obvious rash, lesion, or ulcer.   LABORATORY PANEL:  CBC  Recent Labs Lab 06/25/15 0830 06/26/15 0500  WBC 10.8*  --   HGB 7.1* 7.1*  HCT 21.9*  --   PLT 337  --     Chemistries   Recent Labs Lab 06/21/15 0450  06/24/15 0420 06/25/15 0830  NA 138  < > 132* 139  K 4.5  < > 4.2 4.5  CL 100*  < > 101 100*  CO2 34*  < > 25 31  GLUCOSE 175*  < > 178* 213*  BUN 17  < > 39* 29*  CREATININE 0.77  < > 2.17* 1.05  CALCIUM 8.7*  < > 7.4* 8.4*  MG 1.6*  --   --   --   AST 13*  --  12*  --   ALT 15*  --  12*  --   ALKPHOS 57  --  55  --   BILITOT 1.4*  --  0.7  --   < > = values in this interval not displayed. Cardiac Enzymes  Recent Labs Lab 06/19/15 1342  TROPONINI <0.03   RADIOLOGY:  US Renal  06/24/2015  CLINICAL DATA:  Acute renal failure.  Inpatient. EXAM: RENAL / URINARY TRACT ULTRASOUND COMPLETE COMPARISON:  06/19/2015 CT abdomen/pelvis. FINDINGS: Right Kidney: Length: 11.5 cm. Echogenicity within normal limits. No mass or hydronephrosis visualized. Left Kidney: Length: 12.6 cm. Echogenicity within normal limits. No hydronephrosis. Tiny non shadowing 0.4 cm echogenic focus in the interpolar left kidney appears parenchymal in location, favor milk of calcium within a tiny renal cyst. Bladder: The bladder was not visualized on this scan. IMPRESSION: 1. No hydronephrosis. 2. Tiny non shadowing 0.4 cm echogenic focus in the  interpolar left kidney, which appears parenchymal in location, favor milk of calcium within a tiny renal cyst. No suspicious renal masses. 3. Nondiagnostic evaluation of the bladder, which was not visualized on this scan. Electronically Signed   By: Delbert Phenix M.D.   On: 06/24/2015 15:40   ASSESSMENT AND PLAN:  Seth Stafford is a 55 y.o. male with a known history of Hypertension, sleep apnea using CPAP, RA, type 2 diabetes, morbid obesity comes to the emergency room with abdominal pain for last 3 days. Patient said he started having mid abdominal pain progressively worsening imaging studies found his incarcerated ventral hernia.   * ARF - resolved now. - Due to ATN (creat 0.77->3.35-> 3.21->2.17->1.05  * Anemia - could be hemodilution with some possible blood loss during surgery. - Hb 7.1   * Abdominal pain progressively worsening secondary to incarcerated inguinal hernia management per surgery Status post repair day 4 - Surgery following  - Waiting for a bowel movement  * Hypotension with h/o Hypertension  - holding BP meds  * Chronic anticoagulation with warfarin secondary to history of PE Patient received 2 doses of IV vitamin K to revert INR given need for surgery INR down to 1.4 - change lovenox to heparin for bridging as renal function worsened, continue coumadin  * Type 2 diabetes sliding scale insulin  * Morbid obesity with history of obstructive sleep apnea continue CPAP use  * acute on chronic hypoxia: likely due to sleep apnea.  * History of rheumatoid arthritis. Patient is on prednisone chronic  * DVT prophylaxis per surgery: on full dose anticoagulation (warfarin + Heparin)   Management plans discussed with the patient, family and they are in agreement.  CODE STATUS: Full  DVT Prophylaxis: SCD teds  TOTAL TIME TAKING CARE OF THIS PATIENT: 15 minutes.  >50% time spent on counselling and coordination of care    Note: This dictation was prepared with  Dragon dictation along with smaller phrase technology. Any transcriptional errors that result from this process are unintentional.  [facility], Pardeep Pautz M.D on 06/26/2015 at 12:42 PM  Between 7am to 6pm - Pager - [phone]  After 6pm go to [email or web address] - password [email or web address] [facility]  [place] [facility]  Office  [phone]  CC: Primary care physician; Barbette Reichmann, MD

## 2015-06-27 LAB — TYPE AND SCREEN
ABO/RH(D): O POS
Antibody Screen: NEGATIVE
Unit division: 0
Unit division: 0

## 2015-06-27 LAB — PREPARE RBC (CROSSMATCH)

## 2015-06-27 LAB — GLUCOSE, CAPILLARY
GLUCOSE-CAPILLARY: 209 mg/dL — AB (ref 65–99)
GLUCOSE-CAPILLARY: 203 mg/dL — AB (ref 65–99)
Glucose-Capillary: 168 mg/dL — ABNORMAL HIGH (ref 65–99)

## 2015-06-27 LAB — CBC
HCT: 22.2 % — ABNORMAL LOW (ref 40.0–52.0)
Hemoglobin: 7.2 g/dL — ABNORMAL LOW (ref 13.0–18.0)
MCH: 30.5 pg (ref 26.0–34.0)
MCHC: 32.4 g/dL (ref 32.0–36.0)
MCV: 94 fL (ref 80.0–100.0)
PLATELETS: 381 10*3/uL (ref 150–440)
RBC: 2.36 MIL/uL — ABNORMAL LOW (ref 4.40–5.90)
RDW: 16.4 % — AB (ref 11.5–14.5)
WBC: 9.7 10*3/uL (ref 3.8–10.6)

## 2015-06-27 LAB — PROTIME-INR
INR: 1.65
PROTHROMBIN TIME: 19.5 s — AB (ref 11.4–15.0)

## 2015-06-27 MED ORDER — DIAZEPAM 5 MG PO TABS
5.0000 mg | ORAL_TABLET | Freq: Once | ORAL | Status: AC
  Administered 2015-06-27: 5 mg via ORAL
  Filled 2015-06-27: qty 1

## 2015-06-27 MED ORDER — POLYETHYLENE GLYCOL 3350 17 G PO PACK: 17.0000 g | PACK | Freq: Every day | ORAL | Status: AC

## 2015-06-27 MED ORDER — LORAZEPAM 2 MG/ML IJ SOLN
1.0000 mg | Freq: Once | INTRAMUSCULAR | Status: AC
  Administered 2015-06-27: 1 mg via INTRAVENOUS
  Filled 2015-06-27: qty 1

## 2015-06-27 MED ORDER — CYCLOBENZAPRINE HCL 10 MG PO TABS: 10.0000 mg | ORAL_TABLET | Freq: Three times a day (TID) | ORAL | Status: AC | PRN

## 2015-06-27 MED ORDER — DIAZEPAM 5 MG/ML IJ SOLN: 5.0000 mg | Freq: Once | INTRAMUSCULAR | Status: DC

## 2015-06-27 MED ORDER — HYDROCODONE-ACETAMINOPHEN 5-325 MG PO TABS: 1.0000 | ORAL_TABLET | ORAL | Status: AC | PRN

## 2015-06-27 NOTE — Progress Notes (Signed)
Pt was being checked on. Pt would initially refuse to respond verbally. Pt was wringing his hands and restless in the movements of his feet. Pt asked what happened to get here. Pt was orientated to himself but then reported that he was on a boat and then in a daycare. Pt was oriented to time. Dr Michela Pitcher notified with one-time orders of valium and ativan placed. Pt refused to take it reporting that he was not a drug addict and that his wife was on the way. Pt then reported that starting the day before yesterday that he had been seeing things. He reports that he keeps seeing things rolling across the floor and across the TV.

## 2015-06-27 NOTE — Progress Notes (Signed)
PICC line removed under sterile field prior to discharge. Tolerated without difficulty. Pressure held for five minutes, bleed had discontinued, sterile dressing applied. Patient remained flat for 30 minutes after removal. Instructed to keep dressing clean and dry for 48 hours after removal of line.

## 2015-06-27 NOTE — Progress Notes (Signed)
Patient ID: BERYL BALZ, male   DOB: 09-17-1960, 55 y.o.   MRN: [id number] [facility] [place] [facility]   PATIENT NAME: Seth Stafford    MR#:  [id number]  DATE OF BIRTH:  19-Jul-1960  SUBJECTIVE:  Didn't sleep well, no BM yet    Review of Systems  Constitutional: Positive for malaise/fatigue. Negative for fever, chills and weight loss.  HENT: Negative for ear discharge, ear pain and nosebleeds.   Eyes: Negative for blurred vision, pain and discharge.  Respiratory: Negative for sputum production, shortness of breath, wheezing and stridor.   Cardiovascular: Negative for chest pain, palpitations, orthopnea and PND.  Gastrointestinal: Positive for constipation. Negative for nausea, vomiting, abdominal pain and diarrhea.  Genitourinary: Negative for urgency and frequency.  Musculoskeletal: Negative for back pain and joint pain.  Neurological: Positive for weakness. Negative for sensory change, speech change and focal weakness.  Psychiatric/Behavioral: Negative for depression and hallucinations. The patient is not nervous/anxious.   All other systems reviewed and are negative.  Tolerating Diet: yes Tolerating PT: STR  DRUG ALLERGIES:   Allergies  Allergen Reactions  . Remicade [Infliximab] Anaphylaxis   VITALS:  Blood pressure 143/75, pulse 109, temperature 97.9 F (36.6 C), temperature source Oral, resp. rate 20, height 6' (1.829 m), weight 177.084 kg (390 lb 6.4 oz), SpO2 100 %.  PHYSICAL EXAMINATION:   Physical Exam  GENERAL:  55 y.o.-year-old patient lying in the bed with no acute distress. Morbidly obese EYES: Pupils equal, round, reactive to light and accommodation. No scleral icterus. Extraocular muscles intact.  HEENT: Head atraumatic, normocephalic. Oropharynx and nasopharynx clear.  NECK:  Supple, no jugular venous distention. No thyroid enlargement, no tenderness.  LUNGS: Normal breath sounds bilaterally, no wheezing, rales, rhonchi.  No use of accessory muscles of respiration.  CARDIOVASCULAR: S1, S2 normal. No murmurs, rubs, or gallops. Tachycardia ABDOMEN: Soft, diffuse tenderness +, nondistended. Bowel sounds present. No organomegaly or mass. Surgical dressing present with soaked dressing with serosanginous material, JP drain serosangionous/blood EXTREMITIES: No cyanosis, clubbing or edema b/l.    NEUROLOGIC: Cranial nerves II through XII are intact. No focal Motor or sensory deficits b/l.   PSYCHIATRIC:  patient is alert and oriented x 3.  SKIN: No obvious rash, lesion, or ulcer.   LABORATORY PANEL:  CBC  Recent Labs Lab 06/27/15 0500  WBC 9.7  HGB 7.2*  HCT 22.2*  PLT 381    Chemistries   Recent Labs Lab 06/21/15 0450  06/24/15 0420 06/25/15 0830  NA 138  < > 132* 139  K 4.5  < > 4.2 4.5  CL 100*  < > 101 100*  CO2 34*  < > 25 31  GLUCOSE 175*  < > 178* 213*  BUN 17  < > 39* 29*  CREATININE 0.77  < > 2.17* 1.05  CALCIUM 8.7*  < > 7.4* 8.4*  MG 1.6*  --   --   --   AST 13*  --  12*  --   ALT 15*  --  12*  --   ALKPHOS 57  --  55  --   BILITOT 1.4*  --  0.7  --   < > = values in this interval not displayed. Cardiac Enzymes No results for input(s): TROPONINI in the last 168 hours. RADIOLOGY:  No results found. ASSESSMENT AND PLAN:  Seth Stafford is a 55 y.o. male with a known history of Hypertension, sleep apnea using CPAP, RA, type 2 diabetes, morbid obesity  comes to the emergency room with abdominal pain for last 3 days. Patient said he started having mid abdominal pain progressively worsening imaging studies found his incarcerated ventral hernia.   * ARF - resolved now. - Due to ATN (creat 0.77->3.35-> 3.21->2.17->1.05)  * Anemia - could be hemodilution with some possible blood loss during surgery. - Hb 7.2   * Abdominal pain progressively worsening secondary to incarcerated inguinal hernia management per surgery Status post repair day 5 - Surgery following  - Waiting for a bowel  movement  * Hypotension with h/o Hypertension  - holding BP meds  * Chronic anticoagulation with warfarin secondary to history of PE Patient received 2 doses of IV vitamin K to revert INR given need for surgery INR down to 1.4 - change lovenox to heparin for bridging as renal function worsened, continue coumadin  * Type 2 diabetes sliding scale insulin  * Morbid obesity with history of obstructive sleep apnea continue CPAP use  * acute on chronic hypoxia: likely due to sleep apnea.  * History of rheumatoid arthritis. Patient is on prednisone chronic  * DVT prophylaxis per surgery: on full dose anticoagulation (warfarin + Heparin)   Management plans discussed with the patient, family and they are in agreement.  CODE STATUS: Full  DVT Prophylaxis: SCD teds  TOTAL TIME TAKING CARE OF THIS PATIENT: 15 minutes.  >50% time spent on counselling and coordination of care   Will sign off, Please call us with any questions. D/w Dr Orvis Brill   Note: This dictation was prepared with Dragon dictation along with smaller phrase technology. Any transcriptional errors that result from this process are unintentional.  [facility], Jomaira Darr M.D on 06/27/2015 at 2:29 PM  Between 7am to 6pm - Pager - [phone]  After 6pm go to [email or web address] - password [email or web address] [facility]  [place] [facility]  Office  [phone]  CC: Primary care physician; Barbette Reichmann, MD

## 2015-06-27 NOTE — Progress Notes (Signed)
ANTICOAGULATION CONSULT NOTE - Initial Consult  Pharmacy Consult for Coumadin Indication: h/o PE  Allergies  Allergen Reactions  . Remicade [Infliximab] Anaphylaxis    Patient Measurements: Height: 6' (182.9 cm) Weight: (!) 390 lb 6.4 oz (177.084 kg) IBW/kg (Calculated) : 77.6  Vital Signs: Temp: 97.9 F (36.6 C) (05/21 0401) Temp Source: Oral (05/21 0401) BP: 146/67 mmHg (05/21 0401) Pulse Rate: 114 (05/21 0401)  Labs:  Recent Labs  06/25/15 0830 06/25/15 1040 06/26/15 0500 06/27/15 0500  HGB 7.1*  --  7.1* 7.2*  HCT 21.9*  --   --  22.2*  PLT 337  --   --  381  LABPROT  --  15.4* 17.6* 19.5*  INR  --  1.20 1.44 1.65  CREATININE 1.05  --   --   --     Estimated Creatinine Clearance: 130.4 mL/min (by C-G formula based on Cr of 1.05).   Assessment: 55 y/o M s/p ventral hernia repair on warfarin PTA for h/o PE. Patient received phytonadione 11 mg this admission. Currently on subcutaneous heparin for VTE prophylaxis  Patient takes warfarin 7mg  daily at home  5/15 INR: 1.36 5/17 INR: 1.3, warfarin 7mg  5/18 INR: 1.19, warfarin 7mg  5/19 INR 1.20 Warfarin 7 mg 5/20 INR 1.44 Warfarin 7 mg 5/21 INR 1.65  Goal of Therapy:  INR 2-3  Plan:  INR coming up slowly as expected after vitamin K administration and holding warfarin for several days.  Will continue home dose of coumadin 7mg  daily.  Will recheck INR with AM labs and continue to follow H/H.   6/19, PharmD Clinical Pharmacist   06/27/2015 9:27 AM

## 2015-06-27 NOTE — Discharge Summary (Signed)
Physician Discharge Summary  Patient ID: Seth Stafford MRN: [id number] DOB/AGE: [birth year] 57 y.o.  Admit date: 06/19/2015 Discharge date: 06/27/2015  Admission Diagnoses: incarcerated ventral henria  Discharge Diagnoses:  Active Problems:   Incarcerated ventral hernia   Discharged Condition: good  Hospital Course: 55 yr old male with multiple medical issues, including DM, morbid obesity, COPD, and hx of PE 35yr prior on chronic Warfarin.  Patient doing well after open ventral hernia repair.  Patient has been eating well and passing flatus.  He has been taught how to empty and measure JP drains. He has been evaluated by PT who recommended Home Health PT. He will have f/u with Dr. Orvis Brill in office on Friday 5/26.  He needs to call Dr. Marcello Fennel office on Monday, as he was placed back on Coumadin now with INR 1.7, will need recheck of INR in a couple days.   Consults:medicine   Treatments: surgery: Open ventral hernia repair, Dr. Michela Pitcher 5/15  Discharge Exam: Blood pressure 143/75, pulse 109, temperature 97.9 F (36.6 C), temperature source Oral, resp. rate 20, height 6' (1.829 m), weight 390 lb 6.4 oz (177.084 kg), SpO2 100 %. General appearance: alert, cooperative and no distress GI: obese, soft, inicision c/d/i no drainage, jp drain with serosanginous drainage  Disposition: Final discharge disposition not confirmed  Discharge Instructions    Call MD for:  persistant nausea and vomiting    Complete by:  As directed      Call MD for:  redness, tenderness, or signs of infection (pain, swelling, redness, odor or green/yellow discharge around incision site)    Complete by:  As directed      Call MD for:  severe uncontrolled pain    Complete by:  As directed      Call MD for:  temperature >100.4    Complete by:  As directed      Diet - low sodium heart healthy    Complete by:  As directed      Discharge instructions    Complete by:  As directed   Empty, Measure and recharge JP drain  once daily and as needed     Driving Restrictions    Complete by:  As directed   No driving while on prescription pain medication     Lifting restrictions    Complete by:  As directed   No lifting over 15lbs for 6 weeks     May shower / Bathe    Complete by:  As directed      May walk up steps    Complete by:  As directed      No dressing needed    Complete by:  As directed             Medication List    TAKE these medications        allopurinol 100 MG tablet  Commonly known as:  ZYLOPRIM  Take 100 mg by mouth every morning. *Note dose*     allopurinol 300 MG tablet  Commonly known as:  ZYLOPRIM  Take 300 mg by mouth daily. In the afternoon. *Note dose*     clotrimazole-betamethasone cream  Commonly known as:  LOTRISONE  Apply 1 application topically 2 (two) times daily.     CoQ10 200 MG Caps  Take 200 mg by mouth daily.     cyclobenzaprine 10 MG tablet  Commonly known as:  FLEXERIL  Take 1 tablet (10 mg total) by mouth 3 (three) times daily as needed  for muscle spasms.     ENBREL SURECLICK 50 MG/ML injection  Generic drug:  etanercept  Inject 1 mL into the skin once a week.     folic acid 1 MG tablet  Commonly known as:  FOLVITE  Take 3 mg by mouth daily.     glipiZIDE 10 MG tablet  Commonly known as:  GLUCOTROL  Take 10 mg by mouth 2 (two) times daily.     HYDROcodone-acetaminophen 5-325 MG tablet  Commonly known as:  NORCO/VICODIN  Take 1-2 tablets by mouth every 4 (four) hours as needed for moderate pain.     hydroxychloroquine 200 MG tablet  Commonly known as:  PLAQUENIL  Take 200 mg by mouth 2 (two) times daily.     lisinopril-hydrochlorothiazide 20-12.5 MG tablet  Commonly known as:  PRINZIDE,ZESTORETIC  Take 1 tablet by mouth daily.     metFORMIN 1000 MG tablet  Commonly known as:  GLUCOPHAGE  Take 1,000 mg by mouth 2 (two) times daily with a meal.     methotrexate 2.5 MG tablet  Commonly known as:  RHEUMATREX  Take 25 mg by mouth every 7  (seven) days. Take on Wednesday.     MULTI-VITAMINS Tabs  Take 1 tablet by mouth daily.     polyethylene glycol packet  Commonly known as:  MIRALAX / GLYCOLAX  Take 17 g by mouth daily.     predniSONE 5 MG tablet  Commonly known as:  DELTASONE  Take 10 mg by mouth daily.     RA VITAMIN B-12 TR 1000 MCG Tbcr  Generic drug:  Cyanocobalamin  Take 1,000 mg by mouth daily.     VITAMIN D-1000 MAX ST 1000 units tablet  Generic drug:  Cholecalciferol  Take 1,000 Units by mouth daily.     warfarin 6 MG tablet  Commonly known as:  COUMADIN  Take 6 mg by mouth daily. Take along with 1 mg tablet to equal 7 mg total dose.     warfarin 1 MG tablet  Commonly known as:  COUMADIN  Take 1 mg by mouth daily. Take along with 6 mg tablet to equal total dose of 7 mg.           Follow-up Information    Follow up with Gladis Riffle, MD On 07/02/2015.   Specialty:  Surgery   Why:  f/u Dr. Orvis Brill, 10:45 am on 5/26   Contact information:   [address] [place] [address] [phone]       Signed: Gladis Riffle 06/27/2015, 3:53 PM

## 2015-06-27 NOTE — Care Management Note (Signed)
Case Management Note  Patient Details  Name: Seth Stafford MRN: [id number] Date of Birth: [birth year]  Subjective/Objective:     Referral for HH-PT and RN faxed to [facility] as referral was previously arranged by weekday CM. [name]'s bariatric RW was delivered to him on 06/25/15.                Action/Plan:   Expected Discharge Date:                  Expected Discharge Plan:  Home w Home Health Services  In-House Referral:     Discharge planning Services  CM Consult  Post Acute Care Choice:    Choice offered to:  Patient  DME Arranged:  Dan Humphreys wide DME Agency:  [facility]  HH Arranged:  RN, PT [facility] Agency:  [facility]  Status of Service:  Completed, signed off  Medicare Important Message Given:    Date Medicare IM Given:    Medicare IM give by:    Date Additional Medicare IM Given:    Additional Medicare Important Message give by:     If discussed at Long Length of Stay Meetings, dates discussed:    Additional Comments:  Quorra Rosene A, RN 06/27/2015, 12:52 PM

## 2015-06-29 ENCOUNTER — Emergency Department: Payer: 59

## 2015-06-29 ENCOUNTER — Telehealth: Payer: Self-pay

## 2015-06-29 ENCOUNTER — Emergency Department
Admission: EM | Admit: 2015-06-29 | Discharge: 2015-06-30 | Disposition: A | Discharge status: Home / Self Care | Payer: 59 | Attending: Emergency Medicine | Admitting: Emergency Medicine

## 2015-06-29 DIAGNOSIS — Z9981 Dependence on supplemental oxygen: Secondary | ICD-10-CM | POA: Insufficient documentation

## 2015-06-29 DIAGNOSIS — Z8679 Personal history of other diseases of the circulatory system: Secondary | ICD-10-CM | POA: Insufficient documentation

## 2015-06-29 DIAGNOSIS — Z7952 Long term (current) use of systemic steroids: Secondary | ICD-10-CM | POA: Insufficient documentation

## 2015-06-29 DIAGNOSIS — T148XXA Other injury of unspecified body region, initial encounter: Secondary | ICD-10-CM

## 2015-06-29 DIAGNOSIS — Z79899 Other long term (current) drug therapy: Secondary | ICD-10-CM | POA: Diagnosis not present

## 2015-06-29 DIAGNOSIS — Z9989 Dependence on other enabling machines and devices: Secondary | ICD-10-CM | POA: Diagnosis not present

## 2015-06-29 DIAGNOSIS — K91840 Postprocedural hemorrhage and hematoma of a digestive system organ or structure following a digestive system procedure: Secondary | ICD-10-CM | POA: Diagnosis present

## 2015-06-29 DIAGNOSIS — E119 Type 2 diabetes mellitus without complications: Secondary | ICD-10-CM | POA: Diagnosis not present

## 2015-06-29 DIAGNOSIS — Z7984 Long term (current) use of oral hypoglycemic drugs: Secondary | ICD-10-CM | POA: Diagnosis not present

## 2015-06-29 DIAGNOSIS — I10 Essential (primary) hypertension: Secondary | ICD-10-CM | POA: Diagnosis not present

## 2015-06-29 DIAGNOSIS — Z7901 Long term (current) use of anticoagulants: Secondary | ICD-10-CM | POA: Insufficient documentation

## 2015-06-29 DIAGNOSIS — M069 Rheumatoid arthritis, unspecified: Secondary | ICD-10-CM | POA: Diagnosis not present

## 2015-06-29 LAB — COMPREHENSIVE METABOLIC PANEL
ALBUMIN: 3 g/dL — AB (ref 3.5–5.0)
ALK PHOS: 96 U/L (ref 38–126)
ALT: 67 U/L — ABNORMAL HIGH (ref 17–63)
AST: 61 U/L — AB (ref 15–41)
Anion gap: 10 (ref 5–15)
BILIRUBIN TOTAL: 0.8 mg/dL (ref 0.3–1.2)
BUN: 6 mg/dL (ref 6–20)
CALCIUM: 9.9 mg/dL (ref 8.9–10.3)
CO2: 35 mmol/L — ABNORMAL HIGH (ref 22–32)
Chloride: 93 mmol/L — ABNORMAL LOW (ref 101–111)
Creatinine, Ser: 0.78 mg/dL (ref 0.61–1.24)
GFR calc Af Amer: >60 mL/min (ref >60)
GFR calc non Af Amer: >60 mL/min (ref >60)
GLUCOSE: 154 mg/dL — AB (ref 65–99)
Potassium: 4.1 mmol/L (ref 3.5–5.1)
Sodium: 138 mmol/L (ref 135–145)
TOTAL PROTEIN: 6.8 g/dL (ref 6.5–8.1)

## 2015-06-29 LAB — CBC WITH DIFFERENTIAL/PLATELET
BAND NEUTROPHILS: 2 %
BASOS ABS: 0 10*3/uL (ref 0–0.1)
BLASTS: 0 %
Basophils Relative: 0 %
EOS ABS: 0.2 10*3/uL (ref 0–0.7)
Eosinophils Relative: 2 %
HEMATOCRIT: 27.6 % — AB (ref 40.0–52.0)
HEMOGLOBIN: 8.7 g/dL — AB (ref 13.0–18.0)
LYMPHS PCT: 19 %
Lymphs Abs: 2.1 10*3/uL (ref 1.0–3.6)
MCH: 30 pg (ref 26.0–34.0)
MCHC: 31.7 g/dL — ABNORMAL LOW (ref 32.0–36.0)
MCV: 94.6 fL (ref 80.0–100.0)
METAMYELOCYTES PCT: 4 %
Monocytes Absolute: 0.5 10*3/uL (ref 0.2–1.0)
Monocytes Relative: 4 %
Myelocytes: 4 %
Neutro Abs: 8.5 10*3/uL — ABNORMAL HIGH (ref 1.4–6.5)
Neutrophils Relative %: 65 %
Other: 0 %
PROMYELOCYTES ABS: 0 %
Platelets: 493 10*3/uL — ABNORMAL HIGH (ref 150–440)
RBC: 2.92 MIL/uL — ABNORMAL LOW (ref 4.40–5.90)
RDW: 16.4 % — ABNORMAL HIGH (ref 11.5–14.5)
WBC: 11.3 10*3/uL — ABNORMAL HIGH (ref 3.8–10.6)
nRBC: 0 /100 WBC

## 2015-06-29 LAB — URINALYSIS COMPLETE WITH MICROSCOPIC (ARMC ONLY)
Bacteria, UA: NONE SEEN
Bilirubin Urine: NEGATIVE
Glucose, UA: NEGATIVE mg/dL
Hgb urine dipstick: NEGATIVE
Leukocytes, UA: NEGATIVE
Nitrite: NEGATIVE
Protein, ur: NEGATIVE mg/dL
Specific Gravity, Urine: 1.047 — ABNORMAL HIGH (ref 1.005–1.030)
pH: 6 (ref 5.0–8.0)

## 2015-06-29 LAB — PROTIME-INR
INR: 1.6
Prothrombin Time: 19.1 seconds — ABNORMAL HIGH (ref 11.4–15.0)

## 2015-06-29 LAB — TYPE AND SCREEN
ABO/RH(D): O POS
Antibody Screen: NEGATIVE

## 2015-06-29 LAB — LIPASE, BLOOD: Lipase: 16 U/L (ref 11–51)

## 2015-06-29 MED ORDER — IOPAMIDOL (ISOVUE-300) INJECTION 61%: 125.0000 mL | Freq: Once | INTRAVENOUS | Status: DC | PRN

## 2015-06-29 MED ORDER — SODIUM CHLORIDE 0.9 % IV BOLUS (SEPSIS)
500.0000 mL | Freq: Once | INTRAVENOUS | Status: AC
  Administered 2015-06-29: 500 mL via INTRAVENOUS

## 2015-06-29 MED ORDER — DIATRIZOATE MEGLUMINE & SODIUM 66-10 % PO SOLN
15.0000 mL | Freq: Once | ORAL | Status: AC
  Administered 2015-06-29: 15 mL via ORAL

## 2015-06-29 MED ORDER — IOPAMIDOL (ISOVUE-300) INJECTION 61%
125.0000 mL | Freq: Once | INTRAVENOUS | Status: AC | PRN
  Administered 2015-06-29: 125 mL via INTRAVENOUS

## 2015-06-29 NOTE — ED Notes (Addendum)
Pt arrives to ER c/o increasing drainage in JP drainage over the last few days. Surgery 06/21/17 for hernia. Pt on blood thinners. Wound nurse that came to ER concerned with bleeding. Drain emptied every 3-4 hours per day. Red drainage in JP drain. MD at bedside to assess. Pt denies pain. States "I'm just sick of this". Pt appears mildly pale. Wife at bedside.

## 2015-06-29 NOTE — Telephone Encounter (Signed)
Cindy from [facility] is out to see patient at this time. She states that JP has sanginous drainage and has increased from 90cc within a 24 hour period on 5/21 to now it is 180cc emptied at 1330, followed by another 75cc emptied at 1515 today. Patient states that he has been keeping the drain charged at all times and it is being emptied as soon as it is filling and that this has been the case since he was discharged from the hospital. Nurse states that patient appears pale. Patient denies Nausea, vomiting, blood in stools, fatigue, difficulty breathing, or chest pain at this time.   Will notify physician on call (Dr. Tonita Cong) and await his orders.

## 2015-06-29 NOTE — ED Provider Notes (Signed)
-----------------------------------------   10:26 PM on 06/29/2015 -----------------------------------------  Radiology has called me about the patient's CT scan. There appears to be a small phlegmon versus mild hemorrhage in the right lower quadrant. They also see a approximate 4 x 2 cm area of fluid at the end of the JP drain. I discussed with general surgery, he will be admitting to their service for further treatment and monitoring.   Minna Antis, MD 06/29/15 2226

## 2015-06-29 NOTE — ED Notes (Addendum)
Pt wears 3L [place] at home. Exertional SOB at baseline per patient.  Wound sites color WNL, no drainage or redness at site. Staples on middle of abdomen, incision closed together. JP drain to RLQ, sutured into place. No redness or drainage at that site.  Pt on Coumadin from prior PE, wears oxygen since.

## 2015-06-29 NOTE — Telephone Encounter (Signed)
Spoke with Dr. Tonita Cong regarding this patient at this time. Patient is currently taking Coumadin post-discharge per surgeon and if drainage has not slowed down, patient will need to come to the emergency room to be evaluated.  Return call was made to patient's nurse Arline Asp) [phone] at this time and since speaking with me earlier patient has had another 70cc output and now does not feel as though he has enough strength to get into the truck to come to the emergency room. Arline Asp will inform patient that he needs to be seen immediately and is calling ambulance at this time for patient.  Surgeon aware that patient may be coming to the Emergency Room.

## 2015-06-29 NOTE — ED Notes (Signed)
Patient transported to CT 

## 2015-06-29 NOTE — ED Provider Notes (Addendum)
[facility] Emergency Department Provider Note  ____________________________________________   I have reviewed the triage vital signs and the nursing notes.   HISTORY  Chief Complaint Post-op Problem    HPI Seth Stafford is a 55 y.o. male  with a history of a ventral hernia repair done on 516 of this month, has JP drains, on Coumadin, called his surgeon because he is having increased bloody drainage in his JP drains. See notes in chart for quantities. Patient was sent in here for further evaluation. He denies any nausea vomiting diarrhea. He is not having any dysuria or urinary frequency. He is passing stool. He is on home oxygen does not feel anymore short of breath than normal. Does not have any chest pain. No cough. Feels generally weak but is not sure if he is weaker than he was prior to today.     Past Medical History  Diagnosis Date  . Hypertension   . Diabetes mellitus without complication (HCC)   . RA (rheumatoid arthritis) (HCC)   . Sleep apnea   . CPAP (continuous positive airway pressure) dependence   . On home oxygen therapy   . Morbid (severe) obesity due to excess calories (HCC)   . PE (pulmonary embolism) 2001  . Diverticulosis   . Collagen vascular disease (HCC)   . Difficult intubation     Patient Active Problem List   Diagnosis Date Noted  . Incarcerated ventral hernia 06/19/2015    Past Surgical History  Procedure Laterality Date  . Ventral hernia repair N/A 06/21/2015    Procedure: REPAIR OF INCARCERATED VENTRAL HERNIA;  Surgeon: Tiney Rouge III, MD;  Location: [facility] ORS;  Service: General;  Laterality: N/A;    Current Outpatient Rx  Name  Route  Sig  Dispense  Refill  . allopurinol (ZYLOPRIM) 100 MG tablet   Oral   Take 100 mg by mouth every morning. *Note dose*         . allopurinol (ZYLOPRIM) 300 MG tablet   Oral   Take 300 mg by mouth daily. In the afternoon. *Note dose*         . Cholecalciferol (VITAMIN D-1000  MAX ST) 1000 units tablet   Oral   Take 1,000 Units by mouth daily.         . clotrimazole-betamethasone (LOTRISONE) cream   Topical   Apply 1 application topically 2 (two) times daily.      2   . Coenzyme Q10 (COQ10) 200 MG CAPS   Oral   Take 200 mg by mouth daily.         . Cyanocobalamin (RA VITAMIN B-12 TR) 1000 MCG TBCR   Oral   Take 1,000 mg by mouth daily.         . cyclobenzaprine (FLEXERIL) 10 MG tablet   Oral   Take 1 tablet (10 mg total) by mouth 3 (three) times daily as needed for muscle spasms.   30 tablet   0   . ENBREL SURECLICK 50 MG/ML injection   Subcutaneous   Inject 1 mL into the skin once a week.           Dispense as written.   . folic acid (FOLVITE) 1 MG tablet   Oral   Take 3 mg by mouth daily.         Marland Kitchen glipiZIDE (GLUCOTROL) 10 MG tablet   Oral   Take 10 mg by mouth 2 (two) times daily.         Marland Kitchen  HYDROcodone-acetaminophen (NORCO/VICODIN) 5-325 MG tablet   Oral   Take 1-2 tablets by mouth every 4 (four) hours as needed for moderate pain.   30 tablet   0   . hydroxychloroquine (PLAQUENIL) 200 MG tablet   Oral   Take 200 mg by mouth 2 (two) times daily.         Marland Kitchen lisinopril-hydrochlorothiazide (PRINZIDE,ZESTORETIC) 20-12.5 MG tablet   Oral   Take 1 tablet by mouth daily.         . metFORMIN (GLUCOPHAGE) 1000 MG tablet   Oral   Take 1,000 mg by mouth 2 (two) times daily with a meal.         . methotrexate (RHEUMATREX) 2.5 MG tablet   Oral   Take 25 mg by mouth every 7 (seven) days. Take on Wednesday.         . Multiple Vitamin (MULTI-VITAMINS) TABS   Oral   Take 1 tablet by mouth daily.         . polyethylene glycol (MIRALAX / GLYCOLAX) packet   Oral   Take 17 g by mouth daily.   14 each   0   . predniSONE (DELTASONE) 5 MG tablet   Oral   Take 10 mg by mouth daily.         Marland Kitchen warfarin (COUMADIN) 1 MG tablet   Oral   Take 1 mg by mouth daily. Take along with 6 mg tablet to equal total dose of 7  mg.      3   . warfarin (COUMADIN) 6 MG tablet   Oral   Take 6 mg by mouth daily. Take along with 1 mg tablet to equal 7 mg total dose.      3     Allergies Remicade  No family history on file.  Social History Social History  Substance Use Topics  . Smoking status: Never Smoker   . Smokeless tobacco: None  . Alcohol Use: No    Review of Systems Constitutional: No fever/chills Eyes: No visual changes. ENT: No sore throat. No stiff neck no neck pain Cardiovascular: Denies chest pain. Respiratory: Denies shortness of breath. Gastrointestinal:   no vomiting.  No diarrhea.  No constipation. Genitourinary: Negative for dysuria. Musculoskeletal: Negative lower extremity swelling Skin: Negative for rash. Neurological: Negative for headaches, focal weakness or numbness. 10-point ROS otherwise negative.  ____________________________________________   PHYSICAL EXAM:  VITAL SIGNS: ED Triage Vitals  Enc Vitals Group     BP 06/29/15 1742 149/76 mmHg     Pulse Rate 06/29/15 1742 125     Resp 06/29/15 1742 18     Temp 06/29/15 1742 98.1 F (36.7 C)     Temp Source 06/29/15 1742 Oral     SpO2 06/29/15 1742 100 %     Weight 06/29/15 1742 390 lb (176.903 kg)     Height --      Head Cir --      Peak Flow --      Pain Score 06/29/15 1743 0     Pain Loc --      Pain Edu? --      Excl. in GC? --     Constitutional: Alert and oriented. Well appearing and in no acute distress. Eyes: Conjunctivae are normal. PERRL. EOMI. Head: Atraumatic. Nose: No congestion/rhinnorhea. Mouth/Throat: Mucous membranes are moist.  Oropharynx non-erythematous. Neck: No stridor.   Nontender with no meningismus Cardiovascular:Mild tachycardia noted, regular rhythm. Grossly normal heart sounds.  Good peripheral circulation. Respiratory: Normal  respiratory effort.  No retractions. Lungs CTAB. Abdominal: Morbidly obese, incision is clean dry and intact, some slight erythema but no induration no  evidence of abscess, JP drain is draining sanguinous fluid. There is some mild tenderness to palpation which seems to be consistent with baseline postoperative discomfort. There is no guarding or rebound. Back:  There is no focal tenderness or step off there is no midline tenderness there are no lesions noted. there is no CVA tenderness Musculoskeletal: No lower extremity tenderness. No joint effusions, no DVT signs strong distal pulses Neurologic:  Normal speech and language. No gross focal neurologic deficits are appreciated.  Skin:  Skin is warm, dry and intact. No rash noted. Psychiatric: Mood and affect are normal. Speech and behavior are normal.  ____________________________________________   LABS (all labs ordered are listed, but only abnormal results are displayed)  Labs Reviewed  PROTIME-INR  CBC WITH DIFFERENTIAL/PLATELET  COMPREHENSIVE METABOLIC PANEL  LIPASE, BLOOD  TYPE AND SCREEN   ____________________________________________  EKG  I personally interpreted any EKGs ordered by me or triage  ____________________________________________  RADIOLOGY  I reviewed any imaging ordered by me or triage that were performed during my shift and, if possible, patient and/or family made aware of any abnormal findings. ____________________________________________   PROCEDURES  Procedure(s) performed: None  Critical Care performed: None  ____________________________________________   INITIAL IMPRESSION / ASSESSMENT AND PLAN / ED COURSE  Pertinent labs & imaging results that were available during my care of the patient were reviewed by me and considered in my medical decision making (see chart for details). Patient sent in by surgery for postoperative evaluation increased drainage from JP drain on Coumadin after a ventral hernia repair. Mild tachycardia noted but this seems to be consistent with prior visits. We'll check CBC, INR, and establish IV will discuss with  surgery.  ----------------------------------------- 7:27 PM on 06/29/2015 -----------------------------------------  Discussed with Dr Everlene Farrier of surgery who asked me to CT the patient is a precaution which we will do. Reassuring blood work, vital signs still show mild tachycardia however his hemoglobin is improved at his baseline creatinine is preserved ____________________________________________  ----------------------------------------- 8:50 PM on 06/29/2015 ----------------------------------------- Signed out to Dr. Lenard Lance at the end of my shift.   FINAL CLINICAL IMPRESSION(S) / ED DIAGNOSES  Final diagnoses:  None      This chart was dictated using voice recognition software.  Despite best efforts to proofread,  errors can occur which can change meaning.     Jeanmarie Plant, MD 06/29/15 1747  Jeanmarie Plant, MD 06/29/15 1927  Jeanmarie Plant, MD 06/29/15 2051

## 2015-06-30 LAB — PATHOLOGIST SMEAR REVIEW

## 2015-06-30 NOTE — Progress Notes (Signed)
55 year old well known to our service status post open repair of ventral hernia for incarceration by Dr. Michela Pitcher on May 15. Comes for increase in JP output. Currently they have been emptied every 4 hours and  is serosanguineous. Repeat hemoglobin structurally improvement. The CT scan was personally reviewed there is evidence of chronic inflammatory response in the mesentery that was not unexpected postsurgical period and there is some small fluid collection next to JP. No evidence of abscess or evidence of free air or necrosis. He is taking by mouth and is having bowel movements. He have decreased appetite. He is tachycardic but he has always been tachycardic and his baseline heart rate is in between the 110 San 120s. He is afebrile and on physical exam his abdomen is soft JP with some serosanguineous drainage. And there is no evidence of peritonitis. There is a staples in place and there is no evidence of recurrence.  A/P Increase in JP output d/w with the patient in detail and we will hold his Coumadin for 5 days, he is able to walk and I encourage ambulation. No need for surgical intervention. D/w with him about the possibility of admission and he wants to go home. He does have an appointment next Friday He knows to call if there are any further issues. Discussed in detail with ER team

## 2015-06-30 NOTE — Discharge Instructions (Signed)
You have been examined by Dr. Everlene Farrier from general surgery. He has asked you to hold your Coumadin for the next 5 days and to please follow-up in the office later this week. Please return to the ER for worsening symptoms, persistent vomiting, fever, difficulty breathing or other concerns.

## 2015-06-30 NOTE — ED Provider Notes (Signed)
-----------------------------------------   12:04 AM on 06/30/2015 -----------------------------------------  Patient was evaluated in the emergency department by Dr. Everlene Farrier from general surgery. Dr. Everlene Farrier has reviewed the patient's CT scan as well as examined patient in the treatment room, and recommends discharge home which patient is in agreeance. Despite patient's persistent tachycardia, Dr. Everlene Farrier has discussed with me in person the plan for discharge home and for the patient to hold his Coumadin for the next 5 days and follow-up in the office. Of note, I did review patient's vital signs dating back to 06/21/2015 and it appears that patient remains tachycardic in the 110s to 120s at baseline.  Irean Hong, MD 06/30/15 [phone]

## 2015-07-02 ENCOUNTER — Other Ambulatory Visit: Payer: Self-pay

## 2015-07-02 ENCOUNTER — Ambulatory Visit (INDEPENDENT_AMBULATORY_CARE_PROVIDER_SITE_OTHER): Payer: 59 | Admitting: Surgery

## 2015-07-02 ENCOUNTER — Encounter: Payer: Self-pay | Admitting: Surgery

## 2015-07-02 VITALS — BP 121/76 | HR 123 | Temp 97.7°F | Ht 70.0 in | Wt 390.0 lb

## 2015-07-02 DIAGNOSIS — I2699 Other pulmonary embolism without acute cor pulmonale: Secondary | ICD-10-CM | POA: Insufficient documentation

## 2015-07-02 DIAGNOSIS — K436 Other and unspecified ventral hernia with obstruction, without gangrene: Secondary | ICD-10-CM

## 2015-07-02 DIAGNOSIS — I10 Essential (primary) hypertension: Secondary | ICD-10-CM | POA: Insufficient documentation

## 2015-07-02 DIAGNOSIS — E119 Type 2 diabetes mellitus without complications: Secondary | ICD-10-CM | POA: Insufficient documentation

## 2015-07-02 DIAGNOSIS — M0520 Rheumatoid vasculitis with rheumatoid arthritis of unspecified site: Secondary | ICD-10-CM | POA: Insufficient documentation

## 2015-07-02 DIAGNOSIS — K460 Unspecified abdominal hernia with obstruction, without gangrene: Secondary | ICD-10-CM

## 2015-07-02 DIAGNOSIS — E669 Obesity, unspecified: Secondary | ICD-10-CM | POA: Insufficient documentation

## 2015-07-02 DIAGNOSIS — K219 Gastro-esophageal reflux disease without esophagitis: Secondary | ICD-10-CM | POA: Insufficient documentation

## 2015-07-02 NOTE — Patient Instructions (Signed)
Please see your follow up appointment listed below. Please call our office if you have any questions or concerns. 

## 2015-07-02 NOTE — Progress Notes (Signed)
55 yr old male who recently had incarcerated ventral hernia that was repaired open due to strangulation of the sigmoid colon. Patient was discharged from the hospital and continued to feel weakness and some anxiety.  Patient stopped taking the Flexeril yesterday to try and help with this. Patient was seen in the emergency department a couple days ago where he had repeat labs which are relatively normal in had a CT scan that showed there is still some fluid collection at the JP drain that was draining as well as some thickening of the mesentery around the bowel that was reduced however no signs of necrosis or blood flow compromise or signs of infection. Patient has been feeling run down with a decreased appetite. Patient states that he did have diarrhea for quite a few days and still having some he stopped taking the MiraLAX. Patient does state that he feels dehydrated from this.  Filed Vitals:   07/02/15 1037  BP: 121/76  Pulse: 123  Temp: 97.7 F (36.5 C)   PE:  Gen: NAD Res: crackles in bases Cardio: tachycardic Abd: soft, non-tender, incision c/d/i, every other staple removed, JP drain with serosanginous fluid drainage   A/P:  Patient having some dehydration from decreased intake as well as diarrhea after his hernia repair. Patient does not want a comment to the hospital but is agreeable to attempt to drink 96 ounces of fluid per day. Also discussed getting more protein intake in the form of peanut butter and either ensure other type of supplementals. Patient states that he will attempt to do this as well. Patient does not appear to have any kind of infection at this time has been instructed to call fever secondary to get a fever or chills. To much coming out of the JP drain about 50 mL per hour still in still small amount of fluid collection latest CT scan that continues to need to be drained. We will have him return in one week to see how the JP drain output is doing and to ensure that he's  feeling better.

## 2015-07-07 ENCOUNTER — Ambulatory Visit (INDEPENDENT_AMBULATORY_CARE_PROVIDER_SITE_OTHER): Payer: 59 | Admitting: General Surgery

## 2015-07-07 ENCOUNTER — Encounter: Payer: Self-pay | Admitting: General Surgery

## 2015-07-07 VITALS — BP 133/85 | HR 139 | Temp 98.4°F | Ht 70.0 in | Wt 390.0 lb

## 2015-07-07 DIAGNOSIS — Z4889 Encounter for other specified surgical aftercare: Secondary | ICD-10-CM

## 2015-07-07 NOTE — Progress Notes (Signed)
Outpatient Surgical Follow Up  07/07/2015  Seth Stafford is an 55 y.o. male.   Chief Complaint  Patient presents with  . Routine Post Op    Incarcerated Ventral Hernia Repair (5/16)- Dr. Michela Pitcher    HPI: 55 year old male returns to clinic for follow-up status post incarcerated ventral hernia repair. He's had a JP drain in place that he is desiring to have it removed. He's had minimal output over the last 2 days. He denies any fevers, chills, nausea, vomiting, diarrhea, constipation. He has his chronic chest pain shortness of breath which he is on oxygen 4. He is otherwise continuing to improve and is doing well per him.  Past Medical History  Diagnosis Date  . Hypertension   . Diabetes mellitus without complication (HCC)   . RA (rheumatoid arthritis) (HCC)   . Sleep apnea   . CPAP (continuous positive airway pressure) dependence   . On home oxygen therapy   . Morbid (severe) obesity due to excess calories (HCC)   . PE (pulmonary embolism) 2001  . Diverticulosis   . Collagen vascular disease (HCC)   . Difficult intubation     Past Surgical History  Procedure Laterality Date  . Ventral hernia repair N/A 06/21/2015    Procedure: REPAIR OF INCARCERATED VENTRAL HERNIA;  Surgeon: Tiney Rouge III, MD;  Location: [facility] ORS;  Service: General;  Laterality: N/A;    No family history on file.  Social History:  reports that he has never smoked. He does not have any smokeless tobacco history on file. He reports that he does not drink alcohol or use illicit drugs.  Allergies:  Allergies  Allergen Reactions  . Remicade [Infliximab] Anaphylaxis    Medications reviewed.    ROS A multipoint review of systems was completed. All pertinent positives and negatives are documented in the history of present illness and remainder are negative.   BP 133/85 mmHg  Pulse 139  Temp(Src) 98.4 F (36.9 C) (Oral)  Ht 5\' 10"  (1.778 m)  Wt 176.903 kg (390 lb)  BMI 55.96 kg/m2  Physical Exam  Gen.:  No acute distress Chest: Coarse breath sounds throughout and on oxygen Heart: Tachycardic Abdomen: Very large, soft, appropriately tender to palpation on the incision sites. JP in place in the right lower quadrant with minimal serosanguineous fluid within it. Midline incision with a combination of staples and Steri-Strips in place with hyperemia around the pin no spreading erythema. No evidence of purulent drainage from any site.   No results found for this or any previous visit (from the past 48 hour(s)). No results found.  Assessment/Plan:  1. Aftercare following surgery 55 year old male status post incarcerated ventral hernia repair. Doing well. JP removed in clinic today without difficulty. Remaining staples also removed and replaced with Steri-Strips. Given patient's prolonged course of multiple comorbidities we'll have him return to clinic in 1 week for additional wound check. Discussed signs and symptoms of infection and for him to return to clinic sooner should they occur.     57, MD FACS General Surgeon  07/07/2015,12:04 PM

## 2015-07-07 NOTE — Patient Instructions (Signed)
Please call our office with any questions or concerns.  Please do not submerge in a tub, hot tub, or pool until incisions are completely sealed.  Use sun block to incision area over the next year if this area will be exposed to sun. This helps decrease scarring.  You may now resume your normal activities. Listen to your body when lifting, if you have pain when lifting, stop and then try again in a few days.  If you develop redness, drainage, or pain at incision sites- call our office immediately and speak with a nurse.  Follow-up next week as scheduled below.

## 2015-07-09 ENCOUNTER — Telehealth: Payer: Self-pay | Admitting: Surgery

## 2015-07-09 NOTE — Telephone Encounter (Signed)
Patient had HERNIA REPAIR VENTRAL ADULT with Dr Michela Pitcher on 5/15. The last of the staples were removed on 5/31 and steri strips were placed, but they have been bleeding. He is on blood thinner and doesn't know if this is normal. Please call and advise.

## 2015-07-09 NOTE — Telephone Encounter (Signed)
I spoke with patient and he denies fever, redness, pain. He states there is some oozing from the site, however he feels its just where the staples were removed. He was advised to apply pressure to the site and to continue with his Coumadin.. Patient was instructed to call office, or if over the weekend go to ED,  if he notices redness, pain or increase in drainage. Patient verbalized understanding.

## 2015-07-16 ENCOUNTER — Ambulatory Visit (INDEPENDENT_AMBULATORY_CARE_PROVIDER_SITE_OTHER): Payer: 59 | Admitting: Surgery

## 2015-07-16 ENCOUNTER — Encounter: Payer: Self-pay | Admitting: Surgery

## 2015-07-16 VITALS — BP 134/81 | HR 130 | Temp 97.4°F | Ht 70.0 in | Wt 376.5 lb

## 2015-07-16 DIAGNOSIS — K436 Other and unspecified ventral hernia with obstruction, without gangrene: Secondary | ICD-10-CM

## 2015-07-16 DIAGNOSIS — K460 Unspecified abdominal hernia with obstruction, without gangrene: Secondary | ICD-10-CM

## 2015-07-16 NOTE — Patient Instructions (Signed)
Please call our office if you have any questions or concerns.  

## 2015-07-16 NOTE — Progress Notes (Signed)
Outpatient postop visit  07/16/2015  Seth Stafford is an 55 y.o. male.    Procedure: Repair of incarcerated ventral hernia with Flex HD by Dr. Michela Pitcher  CC: No pain  HPI: Patient describes no pain at this time he is weak but he is on home oxygen and has severe comorbidities including morbid obesity. He is on Coumadin as well and was on Coumadin at the time of his surgery.  Medications reviewed.    Physical Exam:  BP 134/81 mmHg  Pulse 130  Temp(Src) 97.4 F (36.3 C) (Oral)  Ht 5\' 10"  (1.778 m)  Wt 376 lb 8 oz (170.779 kg)  BMI 54.02 kg/m2    PE: Poorly obese male patient on oxygen by nasal cannula very weak-appearing and deconditioned Abdomen is soft nontender wounds healing well no erythema no drainage nontender    Assessment/Plan:  Patient doing very well recommend follow up on an as-needed basis  , MD, FACS

## 2015-07-19 ENCOUNTER — Other Ambulatory Visit: Payer: Self-pay

## 2015-07-19 ENCOUNTER — Telehealth: Payer: Self-pay

## 2015-07-19 NOTE — Telephone Encounter (Signed)
Patient's disability forms were filled out and faxed to [employer].

## 2017-11-10 IMAGING — CT CT ABD-PELV W/ CM
2 of 5 series · 16 of 46 positions shown, 18 images · IV contrast (iopamidol)
Comparison: None.

CLINICAL DATA: 54-year-old male with ventral hernia and abdominal
pain.

EXAM:
CT ABDOMEN AND PELVIS WITH CONTRAST
TECHNIQUE: Multidetector CT imaging of the abdomen and pelvis was performed
using the standard protocol following bolus administration of
intravenous contrast.
CONTRAST:  125mL [id number] IOPAMIDOL ([id number]) INJECTION 61%

[Series 2: routine abd pel with · axial · 0.94mm/px · z∈[-421,+34]mm · 13 of 103 slices shown, 15 images]
[im 6/103  soft-tissue]
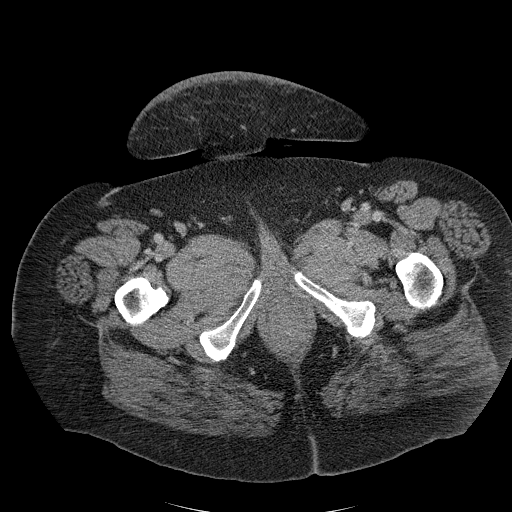
[im 6/103  bone]
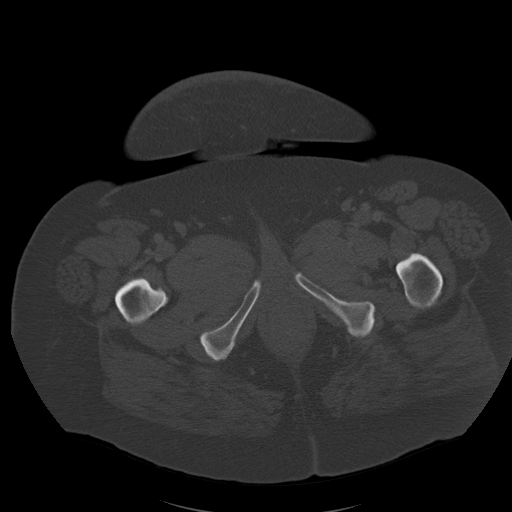
[im 17/103  soft-tissue]
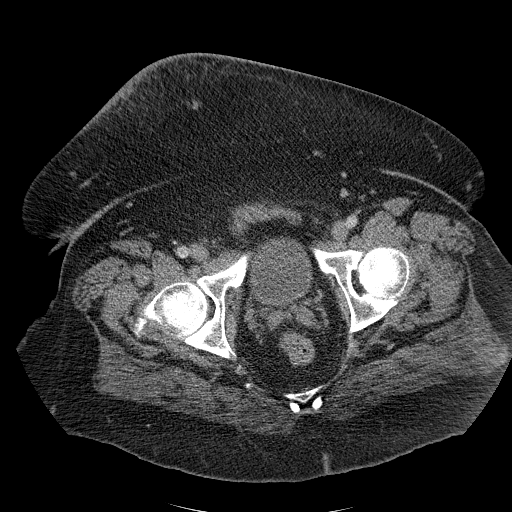
[im 22/103  soft-tissue]
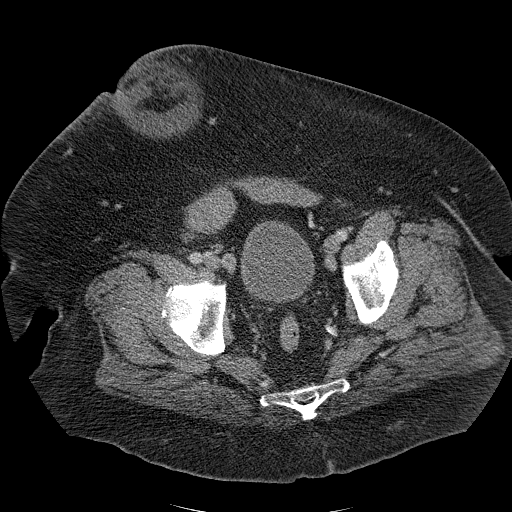
[im 27/103  soft-tissue]
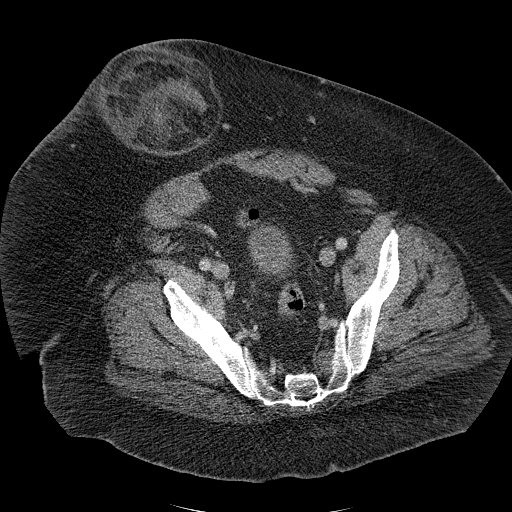
[im 38/103  soft-tissue]
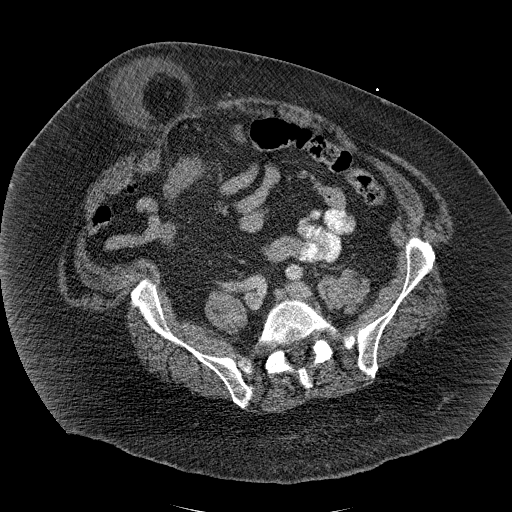
[im 43/103  soft-tissue]
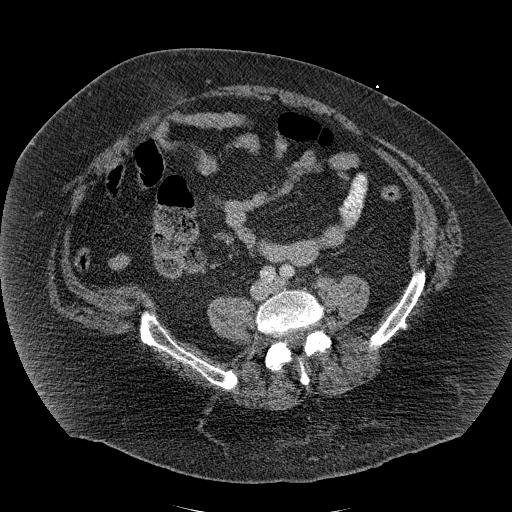
[im 54/103  soft-tissue]
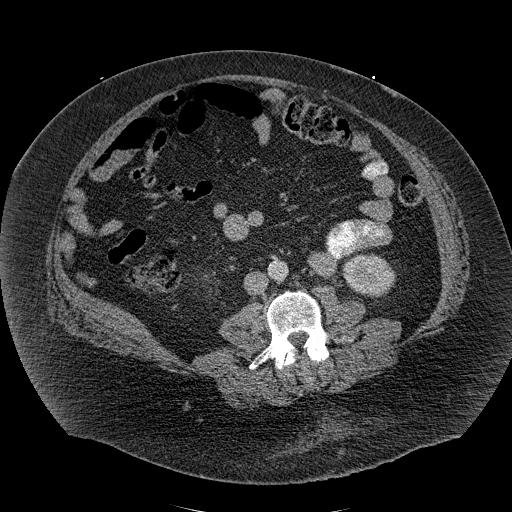
[im 60/103  soft-tissue]
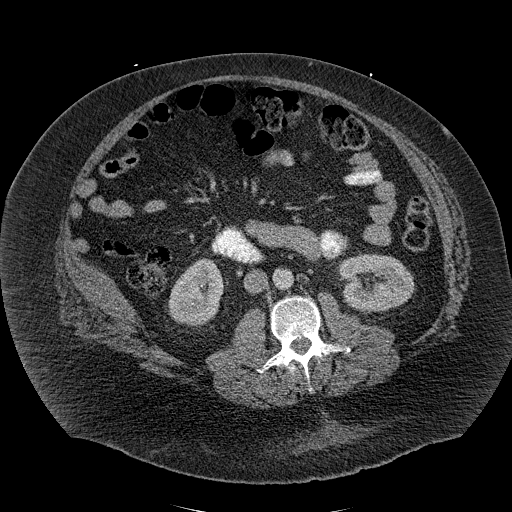
[im 65/103  soft-tissue]
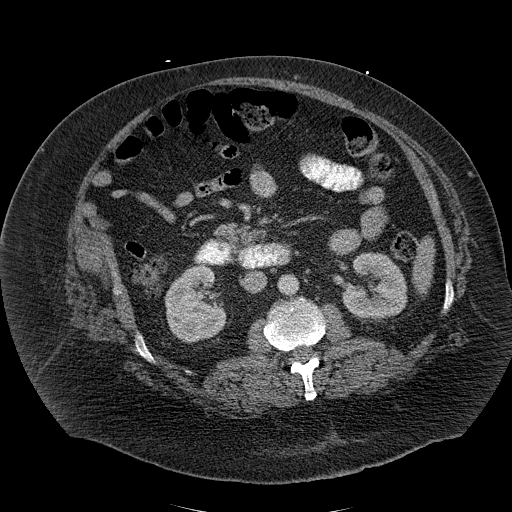
[im 65/103  bone]
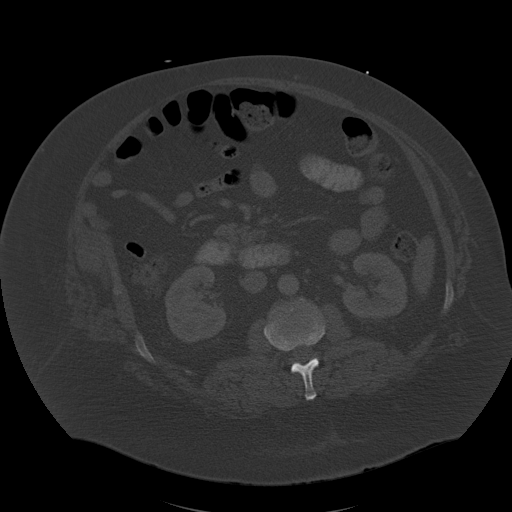
[im 76/103  soft-tissue]
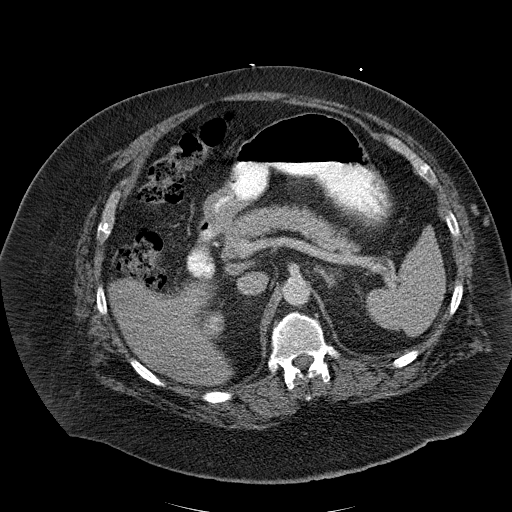
[im 81/103  soft-tissue]
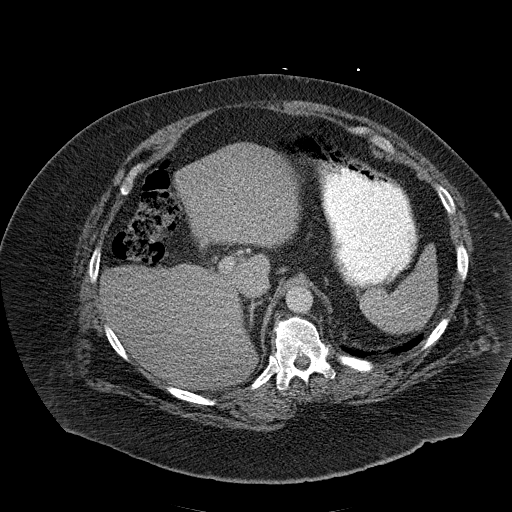
[im 86/103  soft-tissue]
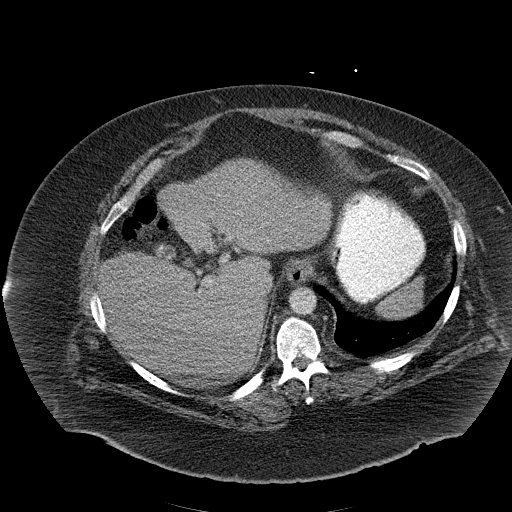
[im 97/103  soft-tissue]
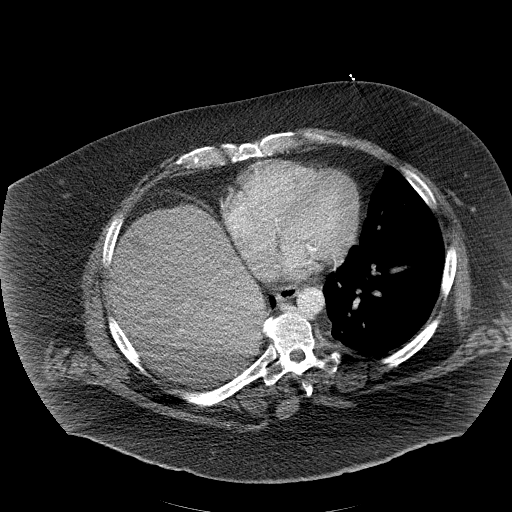

[Series 5: cor routine abd pel with · coronal · 0.90mm/px · 3 of 196 slices shown]
[im 66/196  soft-tissue]
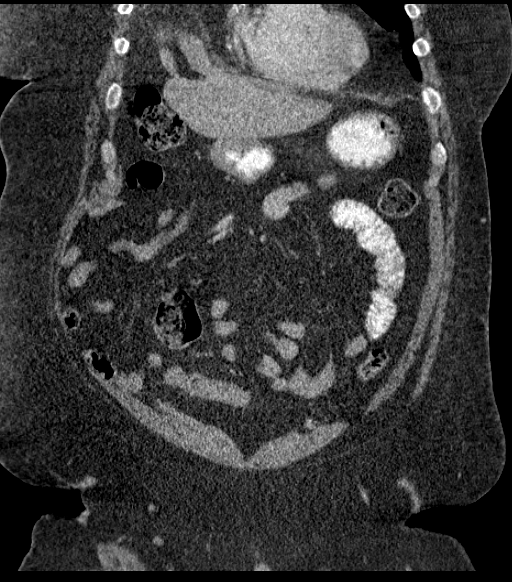
[im 87/196  soft-tissue]
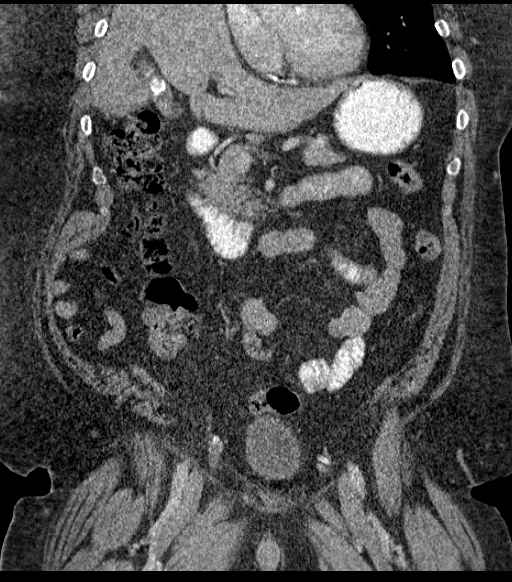
[im 109/196  soft-tissue]
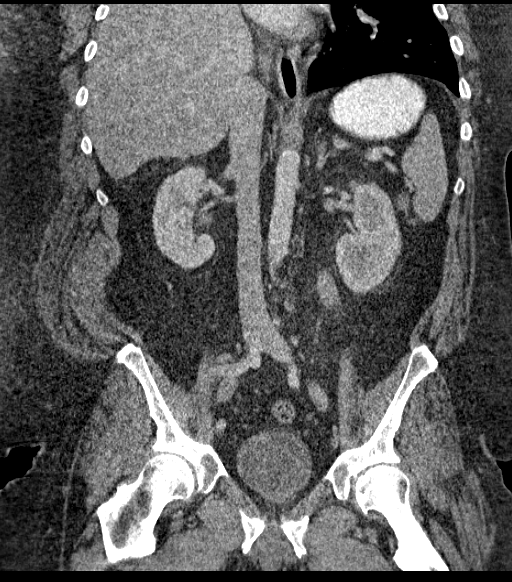

[16 of 46 positions shown; findings below may reference images not displayed]

FINDINGS: Evaluation is limited due to streak artifact caused by patient's
arms.

The visualized left lung base is clear. There is coronary vascular
calcification. No intra-abdominal free air or free fluid identified.

Multiple stones noted within the gallbladder. No pericholecystic
fluid. Ultrasound may provide better evaluation of the gallbladder.
The liver, pancreas, spleen, adrenal glands, kidneys, visualized
ureters, and urinary bladder appear unremarkable. The prostate and
seminal vesicles are grossly unremarkable.

There is a 5 cm defect in the midline anterior abdominal wall at the
level of the umbilicus with herniation of the mesenteric fat. There
is also herniation of a segment of the sigmoid colon within this
hernia. There is inflammatory changes of the herniated segment of
the sigmoid colon with stranding of the herniated fat. Small amount
of fluid noted within the hernia sac. Inflammatory changes of the
sigmoid colon within the hernia sac likely represent acute
diverticulitis and are less likely related to strangulation of the
bowel as the hernia has a wide neck and there is no evidence of
bowel dilatation. No pneumatosis.

Oral contrast opacifies the stomach and multiple loops of small
bowel. There is no evidence of small-bowel obstruction. The appendix
is not visualized with certainty. No inflammatory changes identified
in the right lower quadrant.

The abdominal aorta and IVC appear unremarkable. No portal venous
gas identified. There is no adenopathy. There is skin thickening and
stranding of the subcutaneous fat in the anterior pelvic wall. Mild
degenerative changes of the spine. No acute fracture.
IMPRESSION: Midline lower abdominal ventral or paraumbilical hernia defect
containing a segment of the sigmoid colon. There is inflammatory
changes of the sigmoid colon within the hernia sac likely
representing acute diverticulitis. Strangulation of the herniated
bowel is less likely. Clinical correlation is recommended. No
drainable fluid collection. No free air.

## 2018-11-07 DEATH — deceased
